# Patient Record
Sex: Male | Born: 1965 | Race: Black or African American | Hispanic: No | Smoking: Former smoker
Health system: Southern US, Community
[De-identification: ages and names within clinical notes are randomized; demographics above are authoritative.]

## PROBLEM LIST (undated history)

## (undated) DIAGNOSIS — E119 Type 2 diabetes mellitus without complications: Secondary | ICD-10-CM

## (undated) DIAGNOSIS — I10 Essential (primary) hypertension: Secondary | ICD-10-CM

---

## 2001-10-29 ENCOUNTER — Encounter: Payer: Self-pay | Admitting: Family Medicine

## 2001-10-29 ENCOUNTER — Ambulatory Visit (HOSPITAL_COMMUNITY): Admission: RE | Admit: 2001-10-29 | Discharge: 2001-10-29 | Payer: Self-pay | Admitting: Family Medicine

## 2004-10-12 ENCOUNTER — Emergency Department (HOSPITAL_COMMUNITY): Admission: EM | Admit: 2004-10-12 | Discharge: 2004-10-12 | Payer: Self-pay | Admitting: *Deleted

## 2005-02-13 ENCOUNTER — Ambulatory Visit: Payer: Self-pay | Admitting: Family Medicine

## 2005-02-15 ENCOUNTER — Encounter (HOSPITAL_COMMUNITY): Admission: RE | Admit: 2005-02-15 | Discharge: 2005-03-17 | Payer: Self-pay | Admitting: Family Medicine

## 2007-12-19 ENCOUNTER — Encounter: Payer: Self-pay | Admitting: Family Medicine

## 2008-08-31 ENCOUNTER — Emergency Department (HOSPITAL_COMMUNITY): Admission: EM | Admit: 2008-08-31 | Discharge: 2008-08-31 | Payer: Self-pay | Admitting: Emergency Medicine

## 2011-01-19 NOTE — Letter (Signed)
Summary: Historic Patient File  Historic Patient File   Imported By: Lind Guest 09/19/2010 11:27:15  _____________________________________________________________________  External Attachment:    Type:   Image     Comment:   External Document

## 2015-12-07 ENCOUNTER — Emergency Department (HOSPITAL_COMMUNITY)
Admission: EM | Admit: 2015-12-07 | Discharge: 2015-12-07 | Disposition: A | Discharge status: Home / Self Care | Payer: 59 | Attending: Emergency Medicine | Admitting: Emergency Medicine

## 2015-12-07 ENCOUNTER — Encounter (HOSPITAL_COMMUNITY): Payer: Self-pay | Admitting: Emergency Medicine

## 2015-12-07 DIAGNOSIS — J0300 Acute streptococcal tonsillitis, unspecified: Secondary | ICD-10-CM | POA: Insufficient documentation

## 2015-12-07 DIAGNOSIS — J029 Acute pharyngitis, unspecified: Secondary | ICD-10-CM | POA: Diagnosis present

## 2015-12-07 DIAGNOSIS — Z87891 Personal history of nicotine dependence: Secondary | ICD-10-CM | POA: Diagnosis not present

## 2015-12-07 DIAGNOSIS — M542 Cervicalgia: Secondary | ICD-10-CM | POA: Diagnosis not present

## 2015-12-07 MED ORDER — IBUPROFEN 400 MG PO TABS
600.0000 mg | ORAL_TABLET | Freq: Once | ORAL | Status: AC
  Administered 2015-12-07: 600 mg via ORAL
  Filled 2015-12-07: qty 2

## 2015-12-07 MED ORDER — PREDNISONE 20 MG PO TABS: 60.0000 mg | ORAL_TABLET | Freq: Every day | ORAL | Status: DC

## 2015-12-07 MED ORDER — PENICILLIN G BENZATHINE 1200000 UNIT/2ML IM SUSP
1.2000 10*6.[IU] | Freq: Once | INTRAMUSCULAR | Status: AC
  Administered 2015-12-07: 1.2 10*6.[IU] via INTRAMUSCULAR
  Filled 2015-12-07: qty 2

## 2015-12-07 MED ORDER — IBUPROFEN 600 MG PO TABS: 600.0000 mg | ORAL_TABLET | Freq: Four times a day (QID) | ORAL | Status: DC | PRN

## 2015-12-07 MED ORDER — DEXAMETHASONE SODIUM PHOSPHATE 10 MG/ML IJ SOLN
10.0000 mg | Freq: Once | INTRAMUSCULAR | Status: AC
  Administered 2015-12-07: 10 mg via INTRAMUSCULAR
  Filled 2015-12-07: qty 1

## 2015-12-07 NOTE — Discharge Instructions (Signed)
Return immediately for difficulty breathing, increased swelling, difficulty swallowing, voice changes, persistent fever and chills or for any concerns.   Tonsillitis Tonsillitis is an infection of the throat that causes the tonsils to become red, tender, and swollen. Tonsils are collections of lymphoid tissue at the back of the throat. Each tonsil has crevices (crypts). Tonsils help fight nose and throat infections and keep infection from spreading to other parts of the body for the first 18 months of life.  CAUSES Sudden (acute) tonsillitis is usually caused by infection with streptococcal bacteria. Long-lasting (chronic) tonsillitis occurs when the crypts of the tonsils become filled with pieces of food and bacteria, which makes it easy for the tonsils to become repeatedly infected. SYMPTOMS  Symptoms of tonsillitis include:  A sore throat, with possible difficulty swallowing.  White patches on the tonsils.  Fever.  Tiredness.  New episodes of snoring during sleep, when you did not snore before.  Small, foul-smelling, yellowish-white pieces of material (tonsilloliths) that you occasionally cough up or spit out. The tonsilloliths can also cause you to have bad breath. DIAGNOSIS Tonsillitis can be diagnosed through a physical exam. Diagnosis can be confirmed with the results of lab tests, including a throat culture. TREATMENT  The goals of tonsillitis treatment include the reduction of the severity and duration of symptoms and prevention of associated conditions. Symptoms of tonsillitis can be improved with the use of steroids to reduce the swelling. Tonsillitis caused by bacteria can be treated with antibiotic medicines. Usually, treatment with antibiotic medicines is started before the cause of the tonsillitis is known. However, if it is determined that the cause is not bacterial, antibiotic medicines will not treat the tonsillitis. If attacks of tonsillitis are severe and frequent, your  health care provider may recommend surgery to remove the tonsils (tonsillectomy). HOME CARE INSTRUCTIONS   Rest as much as possible and get plenty of sleep.  Drink plenty of fluids. While the throat is very sore, eat soft foods or liquids, such as sherbet, soups, or instant breakfast drinks.  Eat frozen ice pops.  Gargle with a warm or cold liquid to help soothe the throat. Mix 1/4 teaspoon of salt and 1/4 teaspoon of baking soda in 8 oz of water. SEEK MEDICAL CARE IF:   Large, tender lumps develop in your neck.  A rash develops.  A green, yellow-brown, or bloody substance is coughed up.  You are unable to swallow liquids or food for 24 hours.  You notice that only one of the tonsils is swollen. SEEK IMMEDIATE MEDICAL CARE IF:   You develop any new symptoms such as vomiting, severe headache, stiff neck, chest pain, or trouble breathing or swallowing.  You have severe throat pain along with drooling or voice changes.  You have severe pain, unrelieved with recommended medications.  You are unable to fully open the mouth.  You develop redness, swelling, or severe pain anywhere in the neck.  You have a fever. MAKE SURE YOU:   Understand these instructions.  Will watch your condition.  Will get help right away if you are not doing well or get worse.   This information is not intended to replace advice given to you by your health care provider. Make sure you discuss any questions you have with your health care provider.   Document Released: 09/13/2005 Document Revised: 12/25/2014 Document Reviewed: 05/23/2013 Elsevier Interactive Patient Education [employer]

## 2015-12-07 NOTE — ED Provider Notes (Signed)
CSN: [id number]     Arrival date & time 12/07/15  4098 History   First MD Initiated Contact with Patient 12/07/15 0747     Chief Complaint  Patient presents with  . Sore Throat     (Consider location/radiation/quality/duration/timing/severity/associated sxs/prior Treatment) HPI Patient presents with 2 days of sore throat and chills. States the pain is worse when swallowing. Denies any nasal congestion, itchy eyes or discharge. No fever. No voice changes. No difficulty breathing or cough. No known sick contacts. History reviewed. No pertinent past medical history. History reviewed. No pertinent past surgical history. History reviewed. No pertinent family history. Social History  Substance Use Topics  . Smoking status: Former Smoker    Quit date: 12/06/2006  . Smokeless tobacco: None  . Alcohol Use: 1.8 oz/week    1 Cans of beer, 2 Shots of liquor per week    Review of Systems  Constitutional: Positive for chills. Negative for fever.  HENT: Positive for sore throat. Negative for congestion, ear pain, rhinorrhea, sinus pressure, trouble swallowing and voice change.   Eyes: Negative for pain, discharge, redness and itching.  Respiratory: Negative for cough, wheezing and stridor.   Gastrointestinal: Negative for nausea, vomiting and abdominal pain.  Musculoskeletal: Positive for neck pain. Negative for back pain.  Skin: Negative for rash.  Neurological: Negative for dizziness, weakness, numbness and headaches.  All other systems reviewed and are negative.     Allergies  Review of patient's allergies indicates no known allergies.  Home Medications   Prior to Admission medications   Medication Sig Start Date End Date Taking? Authorizing Provider  aspirin-sod bicarb-citric acid (ALKA-SELTZER) 325 MG TBEF tablet Take 325 mg by mouth every 6 (six) hours as needed.   Yes Historical Provider, MD  guaifenesin (ROBITUSSIN) 100 MG/5ML syrup Take 200 mg by mouth 3 (three) times daily  as needed for cough.   Yes Historical Provider, MD  ibuprofen (ADVIL,MOTRIN) 600 MG tablet Take 1 tablet (600 mg total) by mouth every 6 (six) hours as needed. 12/07/15   Loren Racer, MD  predniSONE (DELTASONE) 20 MG tablet Take 3 tablets (60 mg total) by mouth daily. 12/07/15   Loren Racer, MD   BP 134/84 mmHg  Pulse 81  Temp(Src) 98.8 F (37.1 C) (Oral)  Resp 16  Ht  (1.778 m)  Wt 320 lb (145.151 kg)  BMI 45.92 kg/m2  SpO2 93% Physical Exam  Constitutional: He is oriented to person, place, and time. He appears well-developed and well-nourished. No distress.  HENT:  Head: Normocephalic and atraumatic.  Mouth/Throat: Oropharyngeal exudate present.  Bilateral tonsillar and uvula swelling. Exudate noted. Uvula is midline. Do not see evidence of PTA.  Eyes: EOM are normal. Pupils are equal, round, and reactive to light.  Neck: Normal range of motion. Neck supple.  Submandibular swelling with bilateral lymphadenopathy  Cardiovascular: Normal rate and regular rhythm.  Exam reveals no gallop and no friction rub.   No murmur heard. Pulmonary/Chest: Effort normal and breath sounds normal. No stridor. No respiratory distress. He has no wheezes. He has no rales. He exhibits no tenderness.  Abdominal: Soft. Bowel sounds are normal. There is no tenderness.  Musculoskeletal: Normal range of motion. He exhibits no edema or tenderness.  Lymphadenopathy:    He has cervical adenopathy.  Neurological: He is alert and oriented to person, place, and time.  Moves all extremities without deficit. Sensation is fully intact.  Skin: Skin is warm and dry. No rash noted. No erythema.  Psychiatric: He  has a normal mood and affect. His behavior is normal.  Nursing note and vitals reviewed.   ED Course  Procedures (including critical care time) Labs Review Labs Reviewed - No data to display  Imaging Review No results found. I have personally reviewed and evaluated these images and lab  results as part of my medical decision-making.   EKG Interpretation None      MDM   Final diagnoses:  Strep tonsillitis    Patient with evidence of strep pharyngitis. No respiratory distress. Patient does have significant pharyngeal swelling but suspect some of this is baseline. We'll give IM Decadron and IM penicillin. We'll observe in the emergency department for improvement of the swelling.  Patient states he's had improved swelling and ability to swallow after steroids.  Patient observed for several hours in the emergency department with increasing improvement of posterior oropharyngeal swelling. We'll discharge home. Return precautions have been given and the patient has voiced understanding.  Loren Raceravid Sheelah Ritacco, MD 12/07/15 [id number]

## 2015-12-07 NOTE — ED Notes (Signed)
MD at bedside. 

## 2015-12-07 NOTE — ED Notes (Signed)
Sore throat since Sunday, rates pain 9/10.

## 2018-04-03 ENCOUNTER — Other Ambulatory Visit: Payer: Self-pay

## 2018-04-03 ENCOUNTER — Emergency Department (HOSPITAL_COMMUNITY): Payer: BLUE CROSS/BLUE SHIELD

## 2018-04-03 ENCOUNTER — Encounter (HOSPITAL_COMMUNITY): Payer: Self-pay

## 2018-04-03 ENCOUNTER — Emergency Department (HOSPITAL_COMMUNITY)
Admission: EM | Admit: 2018-04-03 | Discharge: 2018-04-03 | Disposition: A | Discharge status: Home / Self Care | Payer: BLUE CROSS/BLUE SHIELD | Attending: Emergency Medicine | Admitting: Emergency Medicine

## 2018-04-03 DIAGNOSIS — M25562 Pain in left knee: Secondary | ICD-10-CM | POA: Diagnosis present

## 2018-04-03 DIAGNOSIS — Z87891 Personal history of nicotine dependence: Secondary | ICD-10-CM | POA: Diagnosis not present

## 2018-04-03 MED ORDER — KETOROLAC TROMETHAMINE 60 MG/2ML IM SOLN
60.0000 mg | Freq: Once | INTRAMUSCULAR | Status: AC
  Administered 2018-04-03: 60 mg via INTRAMUSCULAR
  Filled 2018-04-03: qty 2

## 2018-04-03 MED ORDER — DICLOFENAC SODIUM 75 MG PO TBEC: 75.0000 mg | DELAYED_RELEASE_TABLET | Freq: Two times a day (BID) | ORAL | 0 refills | Status: DC

## 2018-04-03 NOTE — ED Provider Notes (Signed)
[facility] EMERGENCY DEPARTMENT Provider Note   CSN: [id number] Arrival date & time: 04/03/18  0740     History   Chief Complaint Chief Complaint  Patient presents with  . Knee Pain    HPI Marcus ReeveWilliam D Mis is a 52 y.o. male.  HPI   Marcus Valencia is a 52 y.o. male who presents to the Emergency Department complaining of ongoing left knee pain.  He describes an aching pain to the medial side of the left knee associated with walking and bending of the knee.  Symptoms have been present for 2 months. Pain resolves when at rest.  Does not feel weak or unstable.  He has been applying OTC muscle rub and taking ASA without relief.  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    History reviewed. No pertinent past medical history.  There are no active problems to display for this patient.   History reviewed. No pertinent surgical history.     Home Medications    Prior to Admission medications   Medication Sig Start Date End Date Taking? Authorizing Provider  aspirin-sod bicarb-citric acid (ALKA-SELTZER) 325 MG TBEF tablet Take 325 mg by mouth every 6 (six) hours as needed.    [provider]  guaifenesin (ROBITUSSIN) 100 MG/5ML syrup Take 200 mg by mouth 3 (three) times daily as needed for cough.    [provider]  ibuprofen (ADVIL,MOTRIN) 600 MG tablet Take 1 tablet (600 mg total) by mouth every 6 (six) hours as needed. 12/07/15   Loren RacerYelverton, David, MD  predniSONE (DELTASONE) 20 MG tablet Take 3 tablets (60 mg total) by mouth daily. 12/07/15   Loren RacerYelverton, David, MD    Family History No family history on file.  Social History Social History   Tobacco Use  . Smoking status: Former Smoker    Last attempt to quit: 12/06/2006    Years since quitting: 11.3  . Smokeless tobacco: Never Used  Substance Use Topics  . Alcohol use: Yes    Alcohol/week:  1.8 oz    Types: 1 Cans of beer, 2 Shots of liquor per week    Comment: occ  . Drug use: No     Allergies   Patient has no known allergies.   Review of Systems Review of Systems  Constitutional: Negative for chills and fever.  Musculoskeletal: Positive for arthralgias (left knee pain). Negative for joint swelling.  Skin: Negative for color change and wound.  Neurological: Negative for weakness and numbness.  All other systems reviewed and are negative.    Physical Exam Updated Vital Signs BP (!) 157/96 (BP Location: Left Arm)   Pulse 78   Temp 98.2 F (36.8 C) (Oral)   Resp 18   Ht 5\' 10"  (1.778 m)   Wt (!) 165.6 kg (365 lb)   SpO2 96%   BMI 52.37 kg/m   Physical Exam  Constitutional: He is oriented to person, place, and time. He appears well-developed and well-nourished. No distress.  Cardiovascular: Normal rate, regular rhythm and intact distal pulses.  Pulmonary/Chest: Effort normal and breath sounds normal.  Musculoskeletal: He exhibits tenderness. He exhibits no edema or deformity.  Focal ttp of the medial left knee.  No erythema, edema crepitus, effusion, or step-off deformity.  Pt has full ROM of the joint.  Neg drawer sign  Neurological: He is alert and oriented to person, place, and  time. He exhibits normal muscle tone. Coordination normal.  Skin: Skin is warm and dry. Capillary refill takes less than 2 seconds. No erythema.  Nursing note and vitals reviewed.    ED Treatments / Results  Labs (all labs ordered are listed, but only abnormal results are displayed) Labs Reviewed - No data to display  EKG None  Radiology Dg Knee Complete 4 Views Left  Result Date: 04/03/2018 CLINICAL DATA:  Left knee pain for several months without known injury. EXAM: LEFT KNEE - COMPLETE 4+ VIEW COMPARISON:  None. FINDINGS: No evidence of fracture, dislocation, or joint effusion. No evidence of arthropathy or other focal bone abnormality. Soft tissues are unremarkable.  IMPRESSION: Normal left knee. Electronically Signed   By: Lupita Raider, M.D.   On: 04/03/2018 08:35    Procedures Procedures (including critical care time)  Medications Ordered in ED Medications - No data to display   Initial Impression / Assessment and Plan / ED Course  I have reviewed the triage vital signs and the nursing notes.  Pertinent labs & imaging results that were available during my care of the patient were reviewed by me and considered in my medical decision making (see chart for details).     Pt with persistent pain to the medial knee.  XR neg.  No obvious ligament instability.  No concerning sx's for septic joint.  Agrees to ice, NSAID and close ortho f/u in week if not improving  Knee sleeve applied for support.    Final Clinical Impressions(s) / ED Diagnoses   Final diagnoses:  Acute pain of left knee    ED Discharge Orders    None       Pauline Aus, PA-C 04/03/18 0915    Bethann Berkshire, MD 04/04/18 [phone]

## 2018-04-03 NOTE — Discharge Instructions (Addendum)
Apply ice packs on/off to your knee.  Wear the brace as needed for support, but do not wear it continuously or at bedtime.  Call Dr. Mort SawyersHarrison's office to arrange a follow-up appt in one week if not improving

## 2018-04-03 NOTE — ED Triage Notes (Signed)
Pt c/o left knee pain for the past few months.  Denies injury.  Pt says is just tired of it hurting.

## 2018-08-05 ENCOUNTER — Other Ambulatory Visit: Payer: Self-pay

## 2018-08-05 ENCOUNTER — Emergency Department (HOSPITAL_COMMUNITY)
Admission: EM | Admit: 2018-08-05 | Discharge: 2018-08-05 | Disposition: A | Discharge status: Home / Self Care | Payer: BLUE CROSS/BLUE SHIELD | Attending: Emergency Medicine | Admitting: Emergency Medicine

## 2018-08-05 ENCOUNTER — Encounter (HOSPITAL_COMMUNITY): Payer: Self-pay | Admitting: Emergency Medicine

## 2018-08-05 DIAGNOSIS — X500XXA Overexertion from strenuous movement or load, initial encounter: Secondary | ICD-10-CM | POA: Insufficient documentation

## 2018-08-05 DIAGNOSIS — S39012A Strain of muscle, fascia and tendon of lower back, initial encounter: Secondary | ICD-10-CM | POA: Insufficient documentation

## 2018-08-05 DIAGNOSIS — Y999 Unspecified external cause status: Secondary | ICD-10-CM | POA: Insufficient documentation

## 2018-08-05 DIAGNOSIS — Z79899 Other long term (current) drug therapy: Secondary | ICD-10-CM | POA: Insufficient documentation

## 2018-08-05 DIAGNOSIS — T148XXA Other injury of unspecified body region, initial encounter: Secondary | ICD-10-CM

## 2018-08-05 DIAGNOSIS — Y9389 Activity, other specified: Secondary | ICD-10-CM | POA: Insufficient documentation

## 2018-08-05 DIAGNOSIS — Y929 Unspecified place or not applicable: Secondary | ICD-10-CM | POA: Insufficient documentation

## 2018-08-05 DIAGNOSIS — Z87891 Personal history of nicotine dependence: Secondary | ICD-10-CM | POA: Insufficient documentation

## 2018-08-05 DIAGNOSIS — Y939 Activity, unspecified: Secondary | ICD-10-CM | POA: Insufficient documentation

## 2018-08-05 MED ORDER — ONDANSETRON HCL 4 MG PO TABS
4.0000 mg | ORAL_TABLET | Freq: Once | ORAL | Status: AC
  Administered 2018-08-05: 4 mg via ORAL
  Filled 2018-08-05: qty 1

## 2018-08-05 MED ORDER — DICLOFENAC SODIUM 75 MG PO TBEC: 75.0000 mg | DELAYED_RELEASE_TABLET | Freq: Two times a day (BID) | ORAL | 0 refills | Status: DC

## 2018-08-05 MED ORDER — KETOROLAC TROMETHAMINE 60 MG/2ML IM SOLN
60.0000 mg | Freq: Once | INTRAMUSCULAR | Status: AC
  Administered 2018-08-05: 60 mg via INTRAMUSCULAR
  Filled 2018-08-05: qty 2

## 2018-08-05 MED ORDER — CYCLOBENZAPRINE HCL 10 MG PO TABS: 10.0000 mg | ORAL_TABLET | Freq: Three times a day (TID) | ORAL | 0 refills | Status: DC

## 2018-08-05 MED ORDER — DEXAMETHASONE 4 MG PO TABS: 4.0000 mg | ORAL_TABLET | Freq: Two times a day (BID) | ORAL | 0 refills | Status: DC

## 2018-08-05 MED ORDER — DEXAMETHASONE SODIUM PHOSPHATE 10 MG/ML IJ SOLN
10.0000 mg | Freq: Once | INTRAMUSCULAR | Status: AC
  Administered 2018-08-05: 10 mg via INTRAMUSCULAR
  Filled 2018-08-05: qty 1

## 2018-08-05 NOTE — ED Triage Notes (Signed)
Patient complaining of lower back pain x 5 days. Denies injury.

## 2018-08-05 NOTE — Discharge Instructions (Addendum)
Please use a heating pad to the lower back and buttocks area when sitting or resting.  Please use Decadron and diclofenac 2 times daily with a meal.  May use Flexeril 3 times daily for spasm pain. This medication may cause drowsiness. Please do not drink, drive, or participate in activity that requires concentration while taking this medication.

## 2018-08-09 NOTE — ED Provider Notes (Signed)
[facility] Provider [facility]   CSN: [id number] Arrival date & time: 08/05/18  1018     History   Chief Complaint Chief Complaint  Patient presents with  . Back Pain    HPI Marcus Valencia is a 52 y.o. male.  The history is provided by the patient.  Back Pain   This is a new problem. The current episode started more than 2 days ago. The problem occurs daily. The problem has been gradually worsening. The pain is associated with lifting heavy objects. The pain is present in the lumbar spine. The quality of the pain is described as stabbing (spasm pain). The pain does not radiate. The pain is moderate. The symptoms are aggravated by bending, twisting and certain positions. The pain is the same all the time. Pertinent negatives include no chest pain, no fever, no numbness, no abdominal pain, no bowel incontinence, no perianal numbness, no bladder incontinence, no dysuria and no paresthesias. He has tried heat for the symptoms. The treatment provided no relief.    History reviewed. No pertinent past medical history.  There are no active problems to display for this patient.   History reviewed. No pertinent surgical history.      Home Medications    Prior to Admission medications   Medication Sig Start Date End Date Taking? Authorizing Provider  aspirin-sod bicarb-citric acid (ALKA-SELTZER) 325 MG TBEF tablet Take 325 mg by mouth every 6 (six) hours as needed.    [provider]  cyclobenzaprine (FLEXERIL) 10 MG tablet Take 1 tablet (10 mg total) by mouth 3 (three) times daily. 08/05/18   Ivery QualeBryant, Allard Lightsey, PA-C  dexamethasone (DECADRON) 4 MG tablet Take 1 tablet (4 mg total) by mouth 2 (two) times daily with a meal. 08/05/18   Ivery QualeBryant, Vlada Uriostegui, PA-C  diclofenac (VOLTAREN) 75 MG EC tablet Take 1 tablet (75 mg total) by mouth 2 (two) times daily. 08/05/18   Ivery QualeBryant, Mychael Smock, PA-C  guaifenesin (ROBITUSSIN) 100 MG/5ML syrup Take 200 mg by mouth 3 (three) times daily  as needed for cough.    [provider]    Family History History reviewed. No pertinent family history.  Social History Social History   Tobacco Use  . Smoking status: Former Smoker    Last attempt to quit: 12/06/2006    Years since quitting: 11.6  . Smokeless tobacco: Never Used  Substance Use Topics  . Alcohol use: Yes    Alcohol/week: 3.0 standard drinks    Types: 1 Cans of beer, 2 Shots of liquor per week    Comment: occ  . Drug use: No     Allergies   Patient has no known allergies.   Review of Systems Review of Systems  Constitutional: Negative for activity change and fever.       All ROS Neg except as noted in HPI  HENT: Negative for nosebleeds.   Eyes: Negative for photophobia and discharge.  Respiratory: Negative for cough, shortness of breath and wheezing.   Cardiovascular: Negative for chest pain and palpitations.  Gastrointestinal: Negative for abdominal pain, blood in stool and bowel incontinence.  Genitourinary: Negative for bladder incontinence, dysuria, frequency and hematuria.  Musculoskeletal: Positive for back pain. Negative for arthralgias and neck pain.  Skin: Negative.   Neurological: Negative for dizziness, seizures, speech difficulty, numbness and paresthesias.  Psychiatric/Behavioral: Negative for confusion and hallucinations.     Physical Exam Updated Vital Signs BP 136/90 (BP Location: Right Arm)   Pulse 69   Temp 98.3  F (36.8 C) (Oral)   Resp 20   Ht 5\' 10"  (1.778 m)   Wt (!) 164.2 kg   SpO2 100%   BMI 51.94 kg/m   Physical Exam  Constitutional: He is oriented to person, place, and time. He appears well-developed and well-nourished.  Non-toxic appearance.  HENT:  Head: Normocephalic.  Right Ear: Tympanic membrane and external ear normal.  Left Ear: Tympanic membrane and external ear normal.  Eyes: Pupils are equal, round, and reactive to light. EOM and lids are normal.  Neck: Normal range of motion. Neck supple.  Carotid bruit is not present.  Cardiovascular: Normal rate, regular rhythm, normal heart sounds, intact distal pulses and normal pulses.  Pulmonary/Chest: Breath sounds normal. No respiratory distress.  Abdominal: Soft. Bowel sounds are normal. There is no tenderness. There is no guarding.  Musculoskeletal:       Lumbar back: He exhibits decreased range of motion and spasm.  There is spasm of the lower back.  Pain with attempted range of motion.  Pain with change of position.  No radiation of the back pain.  No change in sensation in the saddle area.  Lymphadenopathy:       Head (right side): No submandibular adenopathy present.       Head (left side): No submandibular adenopathy present.    He has no cervical adenopathy.  Neurological: He is alert and oriented to person, place, and time. He has normal strength. No cranial nerve deficit or sensory deficit.  Skin: Skin is warm and dry.  Psychiatric: He has a normal mood and affect. His speech is normal.  Nursing note and vitals reviewed.    ED Treatments / Results  Labs (all labs ordered are listed, but only abnormal results are displayed) Labs Reviewed - No data to display  EKG None  Radiology No results found.  Procedures Procedures (including critical care time)  Medications Ordered in ED Medications  ketorolac (TORADOL) injection 60 mg (60 mg Intramuscular Given 08/05/18 1129)  ondansetron (ZOFRAN) tablet 4 mg (4 mg Oral Given 08/05/18 1129)  dexamethasone (DECADRON) injection 10 mg (10 mg Intramuscular Given 08/05/18 1129)     Initial Impression / Assessment and Plan / ED Course  I have reviewed the triage vital signs and the nursing notes.  Pertinent labs & imaging results that were available during my care of the patient were reviewed by me and considered in my medical decision making (see chart for details).       Final Clinical Impressions(s) / ED Diagnoses MDm  Vital signs within normal limits.  Pulse  oximetry is 100% on room air.  Within normal limits by my interpretation.  Patient states that he does a lot of twisting, and lifting at work and he thinks he may have injured his back.  Examination suggests muscle strain of the lumbar area.  There is no evidence for cauda equina or other emergent changes.  Patient's gait is slow, but intact.  No evidence of foot drop or signs of weakness.  Patient will be treated with Decadron, diclofenac, and Flexeril.  I have asked him to continue the use of heating pad or heat to his back.  I have also given him the name of the orthopedic on-call for additional evaluation and management if not improving.  Patient is in agreement with this plan.   Final diagnoses:  Muscle strain    ED Discharge Orders         Ordered    dexamethasone (DECADRON) 4  MG tablet  2 times daily with meals     08/05/18 1122    diclofenac (VOLTAREN) 75 MG EC tablet  2 times daily     08/05/18 1122    cyclobenzaprine (FLEXERIL) 10 MG tablet  3 times daily     08/05/18 1122           Ivery Quale, PA-C 08/09/18 1055    Sabas Sous, MD 08/09/18 1650

## 2018-08-11 ENCOUNTER — Other Ambulatory Visit: Payer: Self-pay

## 2018-08-11 ENCOUNTER — Emergency Department (HOSPITAL_COMMUNITY): Payer: PRIVATE HEALTH INSURANCE

## 2018-08-11 ENCOUNTER — Emergency Department (HOSPITAL_COMMUNITY)
Admission: EM | Admit: 2018-08-11 | Discharge: 2018-08-11 | Disposition: A | Discharge status: Home / Self Care | Payer: PRIVATE HEALTH INSURANCE | Attending: Emergency Medicine | Admitting: Emergency Medicine

## 2018-08-11 ENCOUNTER — Encounter (HOSPITAL_COMMUNITY): Payer: Self-pay | Admitting: Emergency Medicine

## 2018-08-11 DIAGNOSIS — M545 Low back pain, unspecified: Secondary | ICD-10-CM

## 2018-08-11 DIAGNOSIS — Z79899 Other long term (current) drug therapy: Secondary | ICD-10-CM | POA: Insufficient documentation

## 2018-08-11 DIAGNOSIS — Z87891 Personal history of nicotine dependence: Secondary | ICD-10-CM | POA: Diagnosis not present

## 2018-08-11 MED ORDER — MELOXICAM 7.5 MG PO TABS: 7.5000 mg | ORAL_TABLET | Freq: Every day | ORAL | 0 refills | Status: DC

## 2018-08-11 MED ORDER — METHOCARBAMOL 500 MG PO TABS: 500.0000 mg | ORAL_TABLET | Freq: Two times a day (BID) | ORAL | 0 refills | Status: DC

## 2018-08-11 NOTE — ED Triage Notes (Signed)
Pt c/o lower back pain. Was dx with lower back strain per pt and given medication here that did not help. States he went back to work on Friday and the back pain is worse. Motrin and ASA with no improvement.

## 2018-08-11 NOTE — Discharge Instructions (Addendum)
Schedule to see the Orthopaedist for evaluation if pain persist.

## 2018-08-12 NOTE — ED Provider Notes (Signed)
[facility] EMERGENCY DEPARTMENT Provider Note   CSN: [id number] Arrival date & time: 08/11/18  1108     History   Chief Complaint Chief Complaint  Patient presents with  . Back Pain    HPI Marcus Valencia is a 52 y.o. male.  The history is provided by the patient. No language interpreter was used.  Back Pain   This is a new problem. The current episode started more than 1 week ago. The problem occurs constantly. The problem has been gradually worsening. The pain is associated with lifting heavy objects. The pain is present in the lumbar spine. The pain is moderate. The symptoms are aggravated by bending. Pertinent negatives include no bladder incontinence, no paresis and no tingling. He has tried nothing for the symptoms. The treatment provided no relief. Risk factors include obesity.    History reviewed. No pertinent past medical history.  There are no active problems to display for this patient.   History reviewed. No pertinent surgical history.      Home Medications    Prior to Admission medications   Medication Sig Start Date End Date Taking? Authorizing Provider  aspirin-sod bicarb-citric acid (ALKA-SELTZER) 325 MG TBEF tablet Take 325 mg by mouth every 6 (six) hours as needed.    [provider]  cyclobenzaprine (FLEXERIL) 10 MG tablet Take 1 tablet (10 mg total) by mouth 3 (three) times daily. 08/05/18   Ivery QualeBryant, Hob[place], PA-C  dexamethasone (DECADRON) 4 MG tablet Take 1 tablet (4 mg total) by mouth 2 (two) times daily with a meal. 08/05/18   Ivery QualeBryant, Hobson, PA-C  diclofenac (VOLTAREN) 75 MG EC tablet Take 1 tablet (75 mg total) by mouth 2 (two) times daily. 08/05/18   Ivery QualeBryant, Hobson, PA-C  guaifenesin (ROBITUSSIN) 100 MG/5ML syrup Take 200 mg by mouth 3 (three) times daily as needed for cough.    [provider]  meloxicam (MOBIC) 7.5 MG tablet Take 1 tablet (7.5 mg total) by mouth daily. 08/11/18   Elson AreasSofia, Mayo Owczarzak K, PA-C  methocarbamol (ROBAXIN) 500  MG tablet Take 1 tablet (500 mg total) by mouth 2 (two) times daily. 08/11/18   Elson AreasSofia, Ineta Sinning K, PA-C    Family History History reviewed. No pertinent family history.  Social History Social History   Tobacco Use  . Smoking status: Former Smoker    Last attempt to quit: 12/06/2006    Years since quitting: 11.6  . Smokeless tobacco: Never Used  Substance Use Topics  . Alcohol use: Yes    Alcohol/week: 3.0 standard drinks    Types: 1 Cans of beer, 2 Shots of liquor per week    Comment: occ  . Drug use: No     Allergies   Patient has no known allergies.   Review of Systems Review of Systems  Genitourinary: Negative for bladder incontinence.  Musculoskeletal: Positive for back pain.  Neurological: Negative for tingling.  All other systems reviewed and are negative.    Physical Exam Updated Vital Signs BP 133/63 (BP Location: Right Arm)   Pulse 88   Temp 98.2 F (36.8 C) (Oral)   Resp 18   Ht 5\' 10"  (1.778 m)   Wt (!) 164.2 kg   SpO2 100%   BMI 51.94 kg/m   Physical Exam  Constitutional: He appears well-developed.  Morbidly obese  HENT:  Head: Normocephalic and atraumatic.  Eyes: Conjunctivae are normal.  Neck: Neck supple.  Cardiovascular: Normal rate and regular rhythm.  No murmur heard. Pulmonary/Chest: Effort normal and breath  sounds normal. No respiratory distress.  Abdominal: Soft. There is no tenderness.  Musculoskeletal: He exhibits no edema.  Neurological: He is alert.  Skin: Skin is warm and dry.  Psychiatric: He has a normal mood and affect.  Nursing note and vitals reviewed.    ED Treatments / Results  Labs (all labs ordered are listed, but only abnormal results are displayed) Labs Reviewed - No data to display  EKG None  Radiology Dg Lumbar Spine Complete  Result Date: 08/11/2018 CLINICAL DATA:  Low back pain for 2 weeks. EXAM: LUMBAR SPINE - COMPLETE 4+ VIEW COMPARISON:  None. FINDINGS: Mild facet degenerative changes present at  L4-5 and L5-S1. Vertebral body heights and alignment are maintained. No acute fracture traumatic subluxation is present. IMPRESSION: 1. Mild degenerative change without acute or focal abnormality to explain back pain otherwise. Electronically Signed   By: Marin Roberts M.D.   On: 08/11/2018 13:20    Procedures Procedures (including critical care time)  Medications Ordered in ED Medications - No data to display   Initial Impression / Assessment and Plan / ED Course  I have reviewed the triage vital signs and the nursing notes.  Pertinent labs & imaging results that were available during my care of the patient were reviewed by me and considered in my medical decision making (see chart for details).     Pt counseled on xrays.  Pt advised exercise to strengthen back,  Weight loss.   Final Clinical Impressions(s) / ED Diagnoses   Final diagnoses:  Acute low back pain without sciatica, unspecified back pain laterality    ED Discharge Orders         Ordered    methocarbamol (ROBAXIN) 500 MG tablet  2 times daily     08/11/18 1334    meloxicam (MOBIC) 7.5 MG tablet  Daily     08/11/18 1334        An After Visit Summary was printed and given to the patient.    Elson Areas, [place] 08/12/18 1610    Donnetta Hutching, MD 08/12/18 [phone]

## 2019-11-03 IMAGING — DX DG KNEE COMPLETE 4+V*L*
4 series · 4 of 4 positions shown · non-contrast
Comparison: None.

CLINICAL DATA: Left knee pain for several months without known
injury.

EXAM:
LEFT KNEE - COMPLETE 4+ VIEW

[knee ap (1 of 3)]
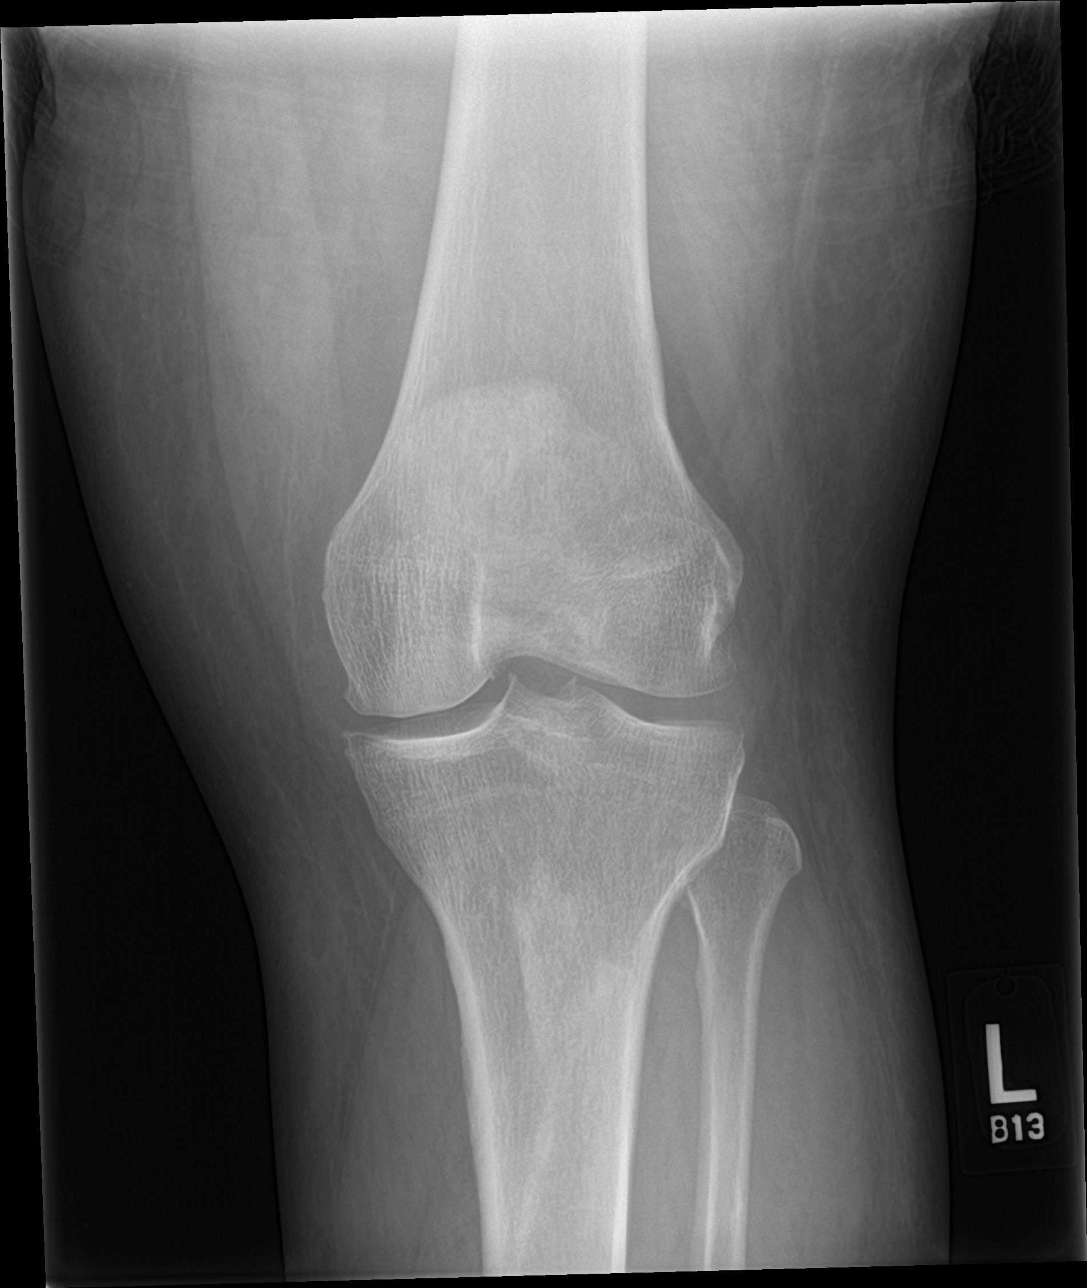

[knee ap (2 of 3)]
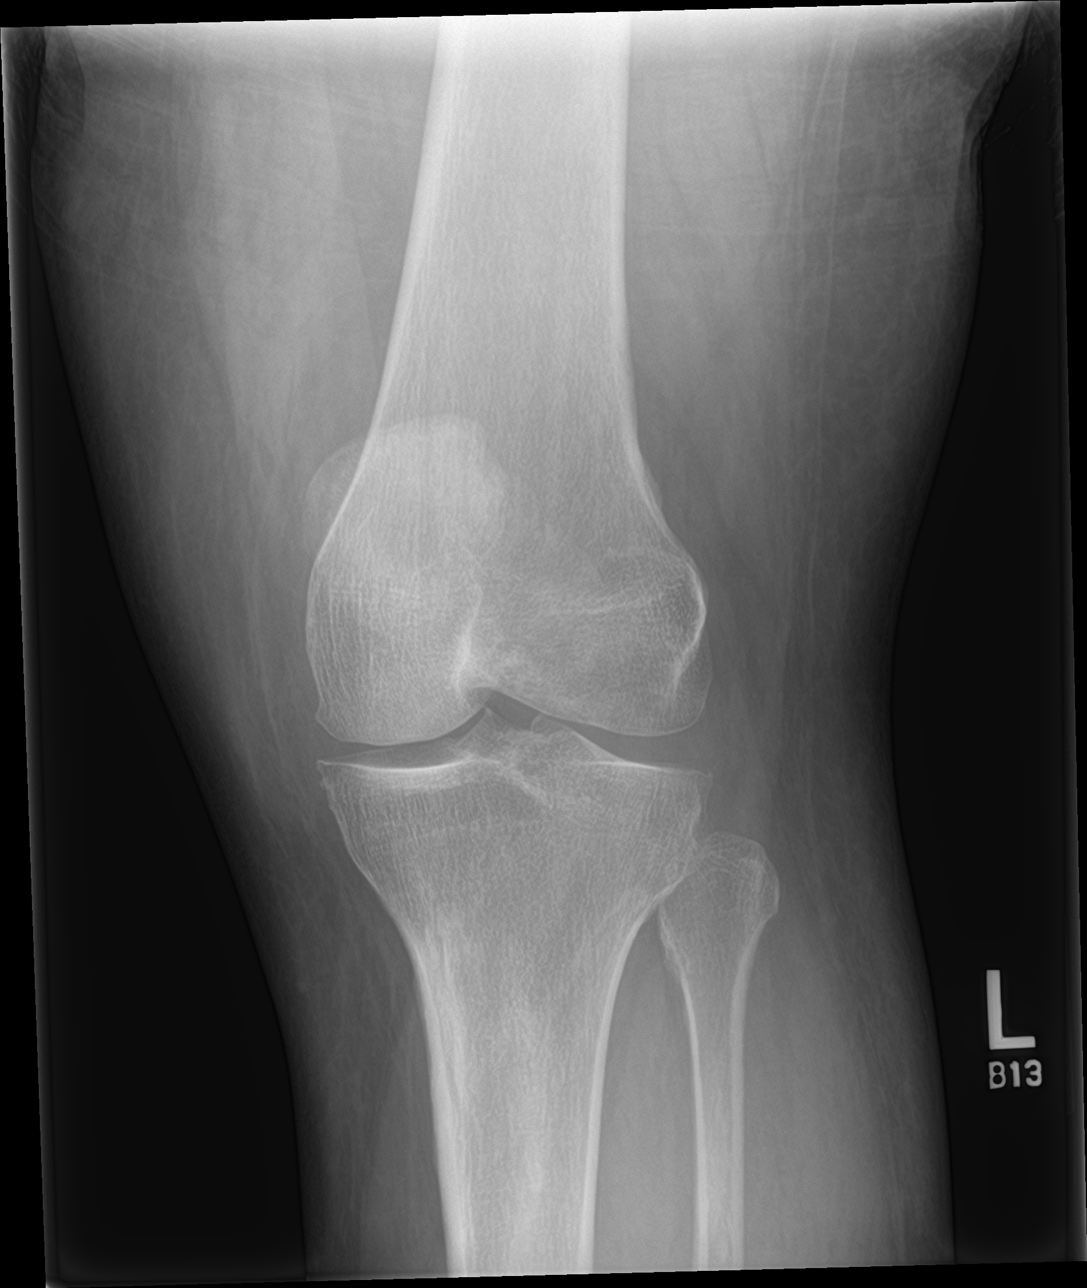

[knee ap (3 of 3)]
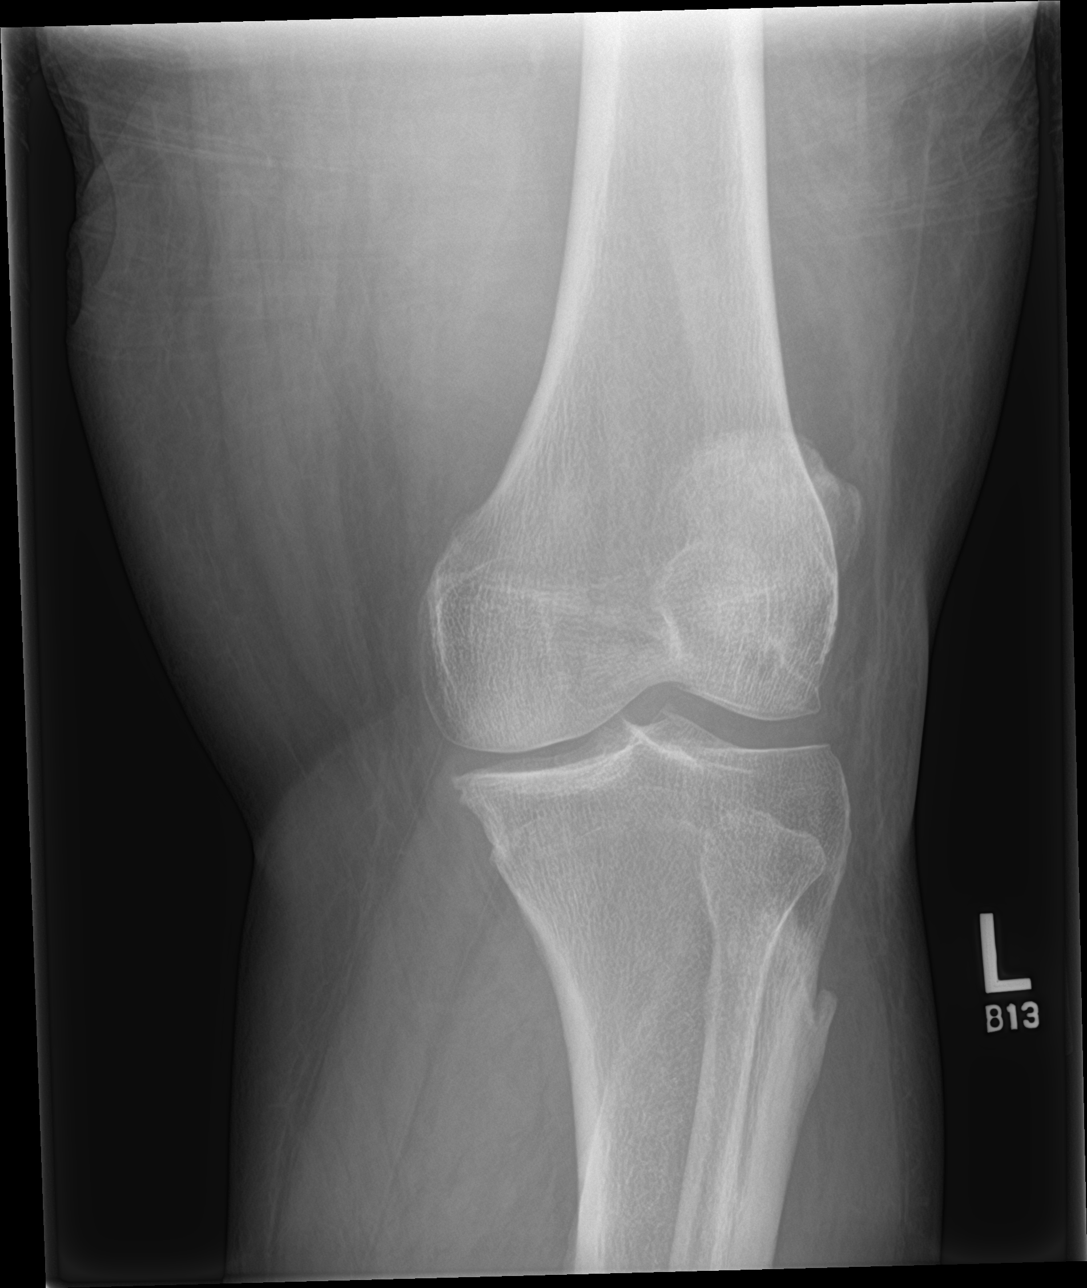

[knee lat]
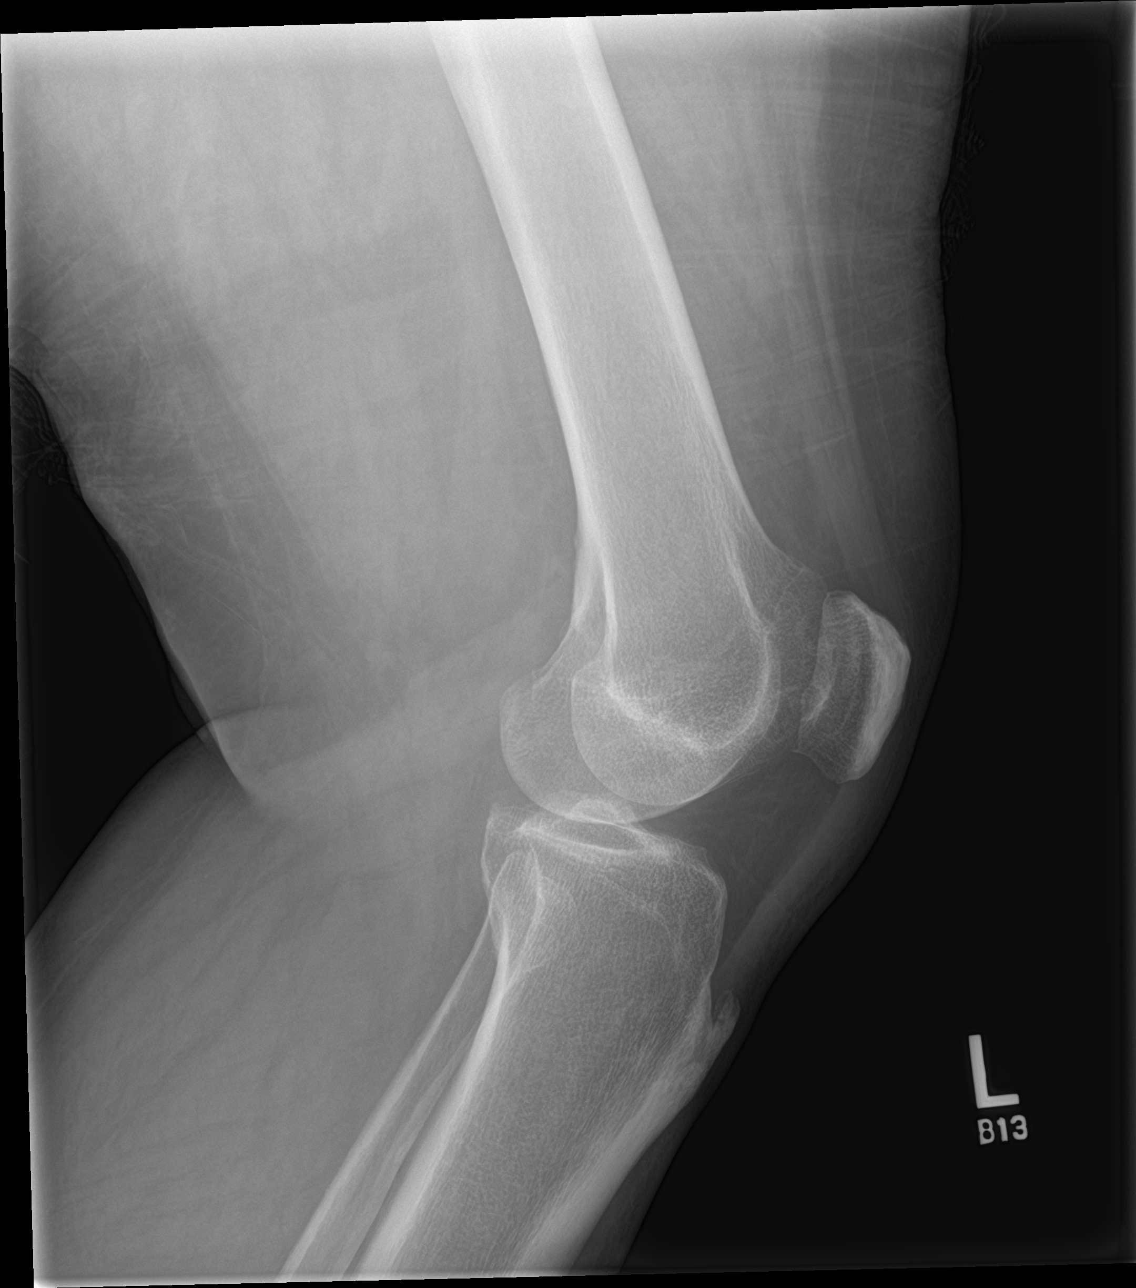

[4 of 4 positions shown; findings below may reference images not displayed]

FINDINGS: No evidence of fracture, dislocation, or joint effusion. No evidence
of arthropathy or other focal bone abnormality. Soft tissues are
unremarkable.
IMPRESSION: Normal left knee.

## 2020-08-10 ENCOUNTER — Other Ambulatory Visit: Payer: Self-pay

## 2020-08-10 ENCOUNTER — Ambulatory Visit
Admission: EM | Admit: 2020-08-10 | Discharge: 2020-08-10 | Disposition: A | Discharge status: Home / Self Care | Payer: 59 | Attending: Emergency Medicine | Admitting: Emergency Medicine

## 2020-08-10 DIAGNOSIS — Z20822 Contact with and (suspected) exposure to covid-19: Secondary | ICD-10-CM

## 2020-08-10 NOTE — Discharge Instructions (Signed)

## 2020-08-10 NOTE — ED Triage Notes (Signed)
Pt presents after being exposed to covid yesterday for test. Denies any symptoms. Pt educated on return precautions.

## 2020-08-11 LAB — SARS-COV-2, NAA 2 DAY TAT

## 2020-08-11 LAB — NOVEL CORONAVIRUS, NAA: SARS-CoV-2, NAA: NOT DETECTED

## 2021-01-14 ENCOUNTER — Ambulatory Visit (INDEPENDENT_AMBULATORY_CARE_PROVIDER_SITE_OTHER): Payer: 59 | Admitting: Internal Medicine

## 2021-01-14 ENCOUNTER — Encounter: Payer: Self-pay | Admitting: Internal Medicine

## 2021-01-14 ENCOUNTER — Other Ambulatory Visit: Payer: Self-pay

## 2021-01-14 VITALS — BP 142/89 | HR 83 | Temp 98.0°F | Resp 18 | Ht 70.0 in | Wt 358.0 lb

## 2021-01-14 DIAGNOSIS — N342 Other urethritis: Secondary | ICD-10-CM | POA: Diagnosis not present

## 2021-01-14 DIAGNOSIS — Z7689 Persons encountering health services in other specified circumstances: Secondary | ICD-10-CM

## 2021-01-14 DIAGNOSIS — R030 Elevated blood-pressure reading, without diagnosis of hypertension: Secondary | ICD-10-CM | POA: Diagnosis not present

## 2021-01-14 MED ORDER — AZITHROMYCIN 500 MG PO TABS: 1000.0000 mg | ORAL_TABLET | Freq: Once | ORAL | 0 refills | Status: AC

## 2021-01-14 MED ORDER — CEFTRIAXONE SODIUM 1 G IJ SOLR
1.0000 g | Freq: Once | INTRAMUSCULAR | Status: AC
Start: 2021-01-14 — End: 2021-01-14
  Administered 2021-01-14: 1 g via INTRAMUSCULAR

## 2021-01-14 NOTE — Progress Notes (Signed)
New Patient Office Visit  Subjective:  Patient ID: Marcus Valencia, male    DOB: February 05, 1966  Age: 55 y.o. MRN: [id number]  CC:  Chief Complaint  Patient presents with  . New Patient (Initial Visit)    New patient has had frequent urination and itching with white discharge thinks he has a yeast infection     HPI Marcus Valencia is a 55 year old male with PMH of morbid obesity who presents for establishing care.  He has been having urinary frequency and urethral discharge, whitish in color. He states that he has pain in the penile area and itching at times. He denies any new sexual partner. Denies any fever, chills, dysuria, hematuria, nausea or vomiting.  His BP was elevated today. He states that he works night shifts and has been awake since last afternoon. Patient denies any headache, dizziness, chest pain, dyspnea or palpitations.  He has had 2 doses of COVID vaccine. Denies flu vaccine.  History reviewed. No pertinent past medical history.  History reviewed. No pertinent surgical history.  History reviewed. No pertinent family history.  Social History   Socioeconomic History  . Marital status: Legally Separated    Spouse name: Not on file  . Number of children: Not on file  . Years of education: Not on file  . Highest education level: Not on file  Occupational History  . Not on file  Tobacco Use  . Smoking status: Former Smoker    Quit date: 12/06/2006    Years since quitting: 14.1  . Smokeless tobacco: Never Used  Substance and Sexual Activity  . Alcohol use: Yes    Alcohol/week: 3.0 standard drinks    Types: 1 Cans of beer, 2 Shots of liquor per week    Comment: occ  . Drug use: No  . Sexual activity: Not on file  Other Topics Concern  . Not on file  Social History Narrative  . Not on file   Social Determinants of Health   Financial Resource Strain: Not on file  Food Insecurity: Not on file  Transportation Needs: Not on file  Physical Activity: Not  on file  Stress: Not on file  Social Connections: Not on file  Intimate Partner Violence: Not on file    ROS Review of Systems  Constitutional: Negative for chills and fever.  HENT: Negative for congestion and sore throat.   Eyes: Negative for pain and discharge.  Respiratory: Negative for cough and shortness of breath.   Cardiovascular: Negative for chest pain and palpitations.  Gastrointestinal: Negative for constipation, diarrhea, nausea and vomiting.  Endocrine: Negative for polydipsia and polyuria.  Genitourinary: Positive for penile discharge and penile pain. Negative for dysuria and hematuria.  Musculoskeletal: Negative for neck pain and neck stiffness.  Skin: Negative for rash.  Neurological: Negative for dizziness, weakness, numbness and headaches.  Psychiatric/Behavioral: Negative for agitation and behavioral problems.    Objective:   Today's Vitals: BP (!) 142/89 (BP Location: Left Arm, Patient Position: Sitting)   Pulse 83   Temp 98 F (36.7 C) (Oral)   Resp 18   Ht '5\' 10"'  (1.778 m)   Wt (!) 358 lb (162.4 kg)   SpO2 99%   BMI 51.37 kg/m   Physical Exam Vitals reviewed.  Constitutional:      General: He is not in acute distress.    Appearance: He is obese. He is not diaphoretic.  HENT:     Head: Normocephalic and atraumatic.     Nose: Nose  normal.     Mouth/Throat:     Mouth: Mucous membranes are moist.  Eyes:     General: No scleral icterus.    Extraocular Movements: Extraocular movements intact.     Pupils: Pupils are equal, round, and reactive to light.  Cardiovascular:     Rate and Rhythm: Normal rate and regular rhythm.     Pulses: Normal pulses.     Heart sounds: Normal heart sounds. No murmur heard.   Pulmonary:     Breath sounds: Normal breath sounds. No wheezing or rales.  Abdominal:     Palpations: Abdomen is soft.     Tenderness: There is no abdominal tenderness.  Musculoskeletal:     Cervical back: Neck supple. No tenderness.      Right lower leg: No edema.     Left lower leg: No edema.  Skin:    General: Skin is warm.     Findings: No rash.  Neurological:     General: No focal deficit present.     Mental Status: He is alert and oriented to person, place, and time.     Sensory: No sensory deficit.     Motor: No weakness.  Psychiatric:        Mood and Affect: Mood normal.        Behavior: Behavior normal.      Assessment & Plan:   Problem List Items Addressed This Visit      Encounter to establish care - Primary   Care established Previous chart reviewed History and medications reviewed with the patient     Relevant Orders  CBC with Differential  CMP14+EGFR  Hemoglobin A1c  Lipid panel  TSH  Vitamin D (25 hydroxy)  HIV antibody (with reflex)  Hepatitis C Antibody    Genitourinary   Urethritis    Whitish discharge and penile pain Check for gonorrhea and chlamydia as well as urethral culture Rocephin given Prescribed Azithromycin       Relevant Medications   azithromycin (ZITHROMAX) 500 MG tablet   Other Relevant Orders   GC/Chlamydia Probe Amp([facility])   Genital Culture   UA/M w/rflx Culture, Routine     Other      Morbid obesity ([place])    Diet modification and moderate exercise/walking at least 150 mins/week.      Elevated BP without diagnosis of hypertension    BP Readings from Last 1 Encounters:  01/14/21 (!) 142/89   New-onset Reports being awake from previous day due to work Advised DASH diet and moderate exercise/walking, at least 150 mins/week          Outpatient Encounter Medications as of 01/14/2021  Medication Sig  . azithromycin (ZITHROMAX) 500 MG tablet Take 2 tablets (1,000 mg total) by mouth once for 1 dose.  . [DISCONTINUED] aspirin-sod bicarb-citric acid (ALKA-SELTZER) 325 MG TBEF tablet Take 325 mg by mouth every 6 (six) hours as needed. (Patient not taking: Reported on 01/14/2021)  . [DISCONTINUED] cyclobenzaprine (FLEXERIL) 10 MG tablet Take 1 tablet (10  mg total) by mouth 3 (three) times daily. (Patient not taking: Reported on 01/14/2021)  . [DISCONTINUED] dexamethasone (DECADRON) 4 MG tablet Take 1 tablet (4 mg total) by mouth 2 (two) times daily with a meal. (Patient not taking: Reported on 01/14/2021)  . [DISCONTINUED] diclofenac (VOLTAREN) 75 MG EC tablet Take 1 tablet (75 mg total) by mouth 2 (two) times daily. (Patient not taking: Reported on 01/14/2021)  . [DISCONTINUED] guaifenesin (ROBITUSSIN) 100 MG/5ML syrup Take 200 mg by mouth 3 (  three) times daily as needed for cough. (Patient not taking: Reported on 01/14/2021)  . [DISCONTINUED] meloxicam (MOBIC) 7.5 MG tablet Take 1 tablet (7.5 mg total) by mouth daily. (Patient not taking: Reported on 01/14/2021)  . [DISCONTINUED] methocarbamol (ROBAXIN) 500 MG tablet Take 1 tablet (500 mg total) by mouth 2 (two) times daily. (Patient not taking: Reported on 01/14/2021)   No facility-administered encounter medications on file as of 01/14/2021.    Follow-up: Return in about 4 weeks (around 02/11/2021) for BP check.   Lindell Spar, MD

## 2021-01-14 NOTE — Assessment & Plan Note (Signed)
Care established Previous chart reviewed History and medications reviewed with the patient 

## 2021-01-14 NOTE — Assessment & Plan Note (Signed)
Diet modification and moderate exercise/walking at least 150 mins/week 

## 2021-01-14 NOTE — Patient Instructions (Addendum)
Please take the antibiotic as prescribed.  Please keep the area clean and dry.  Please follow low carbohydrate diet and perform moderate exercise/walking at least 150 mins/week.

## 2021-01-14 NOTE — Assessment & Plan Note (Signed)
BP Readings from Last 1 Encounters:  01/14/21 (!) 142/89   New-onset Reports being awake from previous day due to work Advised DASH diet and moderate exercise/walking, at least 150 mins/week

## 2021-01-14 NOTE — Assessment & Plan Note (Signed)
Whitish discharge and penile pain Check for gonorrhea and chlamydia as well as urethral culture Rocephin given Prescribed Azithromycin

## 2021-01-15 LAB — CMP14+EGFR
ALT: 20 IU/L (ref 0–44)
AST: 12 IU/L (ref 0–40)
Albumin/Globulin Ratio: 1.5 (ref 1.2–2.2)
Albumin: 4.1 g/dL (ref 3.8–4.9)
Alkaline Phosphatase: 147 IU/L — ABNORMAL HIGH (ref 44–121)
BUN/Creatinine Ratio: 11 (ref 9–20)
BUN: 11 mg/dL (ref 6–24)
Bilirubin Total: 0.2 mg/dL (ref 0.0–1.2)
CO2: 24 mmol/L (ref 20–29)
Calcium: 9.5 mg/dL (ref 8.7–10.2)
Chloride: 102 mmol/L (ref 96–106)
Creatinine, Ser: 1 mg/dL (ref 0.76–1.27)
GFR calc Af Amer: 100 mL/min/{1.73_m2} (ref >59)
GFR calc non Af Amer: 86 mL/min/{1.73_m2} (ref >59)
Globulin, Total: 2.7 g/dL (ref 1.5–4.5)
Glucose: 270 mg/dL — ABNORMAL HIGH (ref 65–99)
Potassium: 4.2 mmol/L (ref 3.5–5.2)
Sodium: 139 mmol/L (ref 134–144)
Total Protein: 6.8 g/dL (ref 6.0–8.5)

## 2021-01-15 LAB — CBC WITH DIFFERENTIAL/PLATELET
Basophils Absolute: 0 10*3/uL (ref 0.0–0.2)
Basos: 1 %
EOS (ABSOLUTE): 0.3 10*3/uL (ref 0.0–0.4)
Eos: 4 %
Hematocrit: 46.3 % (ref 37.5–51.0)
Hemoglobin: 14.9 g/dL (ref 13.0–17.7)
Immature Grans (Abs): 0 10*3/uL (ref 0.0–0.1)
Immature Granulocytes: 1 %
Lymphocytes Absolute: 2.5 10*3/uL (ref 0.7–3.1)
Lymphs: 35 %
MCH: 26.6 pg (ref 26.6–33.0)
MCHC: 32.2 g/dL (ref 31.5–35.7)
MCV: 83 fL (ref 79–97)
Monocytes Absolute: 0.4 10*3/uL (ref 0.1–0.9)
Monocytes: 6 %
Neutrophils Absolute: 3.7 10*3/uL (ref 1.4–7.0)
Neutrophils: 53 %
Platelets: 281 10*3/uL (ref 150–450)
RBC: 5.6 x10E6/uL (ref 4.14–5.80)
RDW: 14 % (ref 11.6–15.4)
WBC: 7 10*3/uL (ref 3.4–10.8)

## 2021-01-15 LAB — MICROSCOPIC EXAMINATION
Bacteria, UA: NONE SEEN
Casts: NONE SEEN /lpf
Epithelial Cells (non renal): NONE SEEN /hpf (ref 0–10)
RBC: NONE SEEN /hpf (ref 0–2)

## 2021-01-15 LAB — LIPID PANEL
Chol/HDL Ratio: 4.6 ratio (ref 0.0–5.0)
Cholesterol, Total: 171 mg/dL (ref 100–199)
HDL: 37 mg/dL — ABNORMAL LOW (ref >39)
LDL Chol Calc (NIH): 121 mg/dL — ABNORMAL HIGH (ref 0–99)
Triglycerides: 65 mg/dL (ref 0–149)
VLDL Cholesterol Cal: 13 mg/dL (ref 5–40)

## 2021-01-15 LAB — UA/M W/RFLX CULTURE, ROUTINE
Bilirubin, UA: NEGATIVE
Ketones, UA: NEGATIVE
Leukocytes,UA: NEGATIVE
Nitrite, UA: NEGATIVE
Protein,UA: NEGATIVE
RBC, UA: NEGATIVE
Specific Gravity, UA: >=1.03 — AB (ref 1.005–1.030)
Urobilinogen, Ur: 1 mg/dL (ref 0.2–1.0)
pH, UA: 6 (ref 5.0–7.5)

## 2021-01-15 LAB — TSH: TSH: 1.08 u[IU]/mL (ref 0.450–4.500)

## 2021-01-15 LAB — HEPATITIS C ANTIBODY: Hep C Virus Ab: <0.1 s/co ratio (ref 0.0–0.9)

## 2021-01-15 LAB — HIV ANTIBODY (ROUTINE TESTING W REFLEX): HIV Screen 4th Generation wRfx: NONREACTIVE

## 2021-01-15 LAB — HEMOGLOBIN A1C
Est. average glucose Bld gHb Est-mCnc: 278 mg/dL
Hgb A1c MFr Bld: 11.3 % — ABNORMAL HIGH (ref 4.8–5.6)

## 2021-01-15 LAB — VITAMIN D 25 HYDROXY (VIT D DEFICIENCY, FRACTURES): Vit D, 25-Hydroxy: 11.5 ng/mL — ABNORMAL LOW (ref 30.0–100.0)

## 2021-01-17 ENCOUNTER — Other Ambulatory Visit: Payer: Self-pay | Admitting: *Deleted

## 2021-01-17 ENCOUNTER — Other Ambulatory Visit (HOSPITAL_COMMUNITY)
Admission: RE | Admit: 2021-01-17 | Discharge: 2021-01-17 | Disposition: A | Discharge status: Home / Self Care | Payer: 59 | Source: Ambulatory Visit | Attending: Internal Medicine | Admitting: Internal Medicine

## 2021-01-17 ENCOUNTER — Other Ambulatory Visit: Payer: Self-pay | Admitting: Internal Medicine

## 2021-01-17 DIAGNOSIS — E559 Vitamin D deficiency, unspecified: Secondary | ICD-10-CM

## 2021-01-17 DIAGNOSIS — E785 Hyperlipidemia, unspecified: Secondary | ICD-10-CM

## 2021-01-17 DIAGNOSIS — N342 Other urethritis: Secondary | ICD-10-CM | POA: Insufficient documentation

## 2021-01-17 DIAGNOSIS — E1165 Type 2 diabetes mellitus with hyperglycemia: Secondary | ICD-10-CM

## 2021-01-17 LAB — GC/CHLAMYDIA PROBE AMP
Chlamydia trachomatis, NAA: NEGATIVE
Neisseria Gonorrhoeae by PCR: NEGATIVE

## 2021-01-17 MED ORDER — METFORMIN HCL 500 MG PO TABS: 500.0000 mg | ORAL_TABLET | Freq: Two times a day (BID) | ORAL | 1 refills | Status: DC

## 2021-01-17 MED ORDER — VITAMIN D (ERGOCALCIFEROL) 1.25 MG (50000 UNIT) PO CAPS: 50000.0000 [IU] | ORAL_CAPSULE | ORAL | 5 refills | Status: AC

## 2021-01-17 MED ORDER — ROSUVASTATIN CALCIUM 10 MG PO TABS: 10.0000 mg | ORAL_TABLET | Freq: Every day | ORAL | 1 refills | Status: DC

## 2021-01-17 MED ORDER — GLIPIZIDE 10 MG PO TABS: 10.0000 mg | ORAL_TABLET | Freq: Two times a day (BID) | ORAL | 3 refills | Status: DC

## 2021-01-17 NOTE — Progress Notes (Signed)
cytolog

## 2021-01-18 LAB — CYTOLOGY, (ORAL, ANAL, URETHRAL) ANCILLARY ONLY
Chlamydia: NEGATIVE
Comment: NEGATIVE
Comment: NEGATIVE
Comment: NORMAL
Neisseria Gonorrhea: NEGATIVE
Trichomonas: NEGATIVE

## 2021-02-11 ENCOUNTER — Ambulatory Visit: Payer: 59 | Admitting: Internal Medicine

## 2021-09-02 ENCOUNTER — Encounter: Payer: Self-pay | Admitting: Emergency Medicine

## 2021-09-02 ENCOUNTER — Ambulatory Visit
Admission: EM | Admit: 2021-09-02 | Discharge: 2021-09-02 | Disposition: A | Discharge status: Home / Self Care | Payer: 59 | Attending: Emergency Medicine | Admitting: Emergency Medicine

## 2021-09-02 DIAGNOSIS — E119 Type 2 diabetes mellitus without complications: Secondary | ICD-10-CM | POA: Insufficient documentation

## 2021-09-02 DIAGNOSIS — N481 Balanitis: Secondary | ICD-10-CM

## 2021-09-02 DIAGNOSIS — I10 Essential (primary) hypertension: Secondary | ICD-10-CM | POA: Insufficient documentation

## 2021-09-02 HISTORY — DX: Type 2 diabetes mellitus without complications: E11.9

## 2021-09-02 HISTORY — DX: Essential (primary) hypertension: I10

## 2021-09-02 MED ORDER — CLOTRIMAZOLE 1 % EX CREA: TOPICAL_CREAM | CUTANEOUS | 0 refills | Status: AC

## 2021-09-02 MED ORDER — FLUCONAZOLE 200 MG PO TABS: ORAL_TABLET | ORAL | 0 refills | Status: DC

## 2021-09-02 NOTE — Discharge Instructions (Signed)
Ensure genital cleansing with retraction of foreskin Twice daily bathing of the affected area with saline solution can be helpful Clotrimazole 1% cream prescribed.  Use twice daily for 7-14 days Prescribed diflucan 200 mg once daily and then second dose 72 hours later Follow up with PCP for recheck Return or go to the ER if you have any new or worsening symptoms such as pain, increased redness or swelling of foreskin, inability to retract foreskin, etc..Marland Kitchen

## 2021-09-02 NOTE — ED Provider Notes (Signed)
[facility]   [id number] 09/02/21 Arrival Time: 1139   [id number] itching  SUBJECTIVE:  Marcus Valencia is a 54 y.o. male who presents requesting penile itching x 1 week.  Denies precipitating event or trauma.  Sexually active with 1 male over the past 2 years.  Has tried gold bond without relief.  Denies similar symptoms in the past.  Complains of associated white discharge on penis.  Denies fever, chills, nausea, vomiting, abdominal or pelvic pain, testicular swelling or pain.     Hx significant for DM.     No LMP for male patient.  ROS: As per HPI.  All other pertinent ROS negative.     Past Medical History:  Diagnosis Date   Diabetes (HCC)    Hypertension    History reviewed. No pertinent surgical history. No Known Allergies No current facility-administered medications on file prior to encounter.   Current Outpatient Medications on File Prior to Encounter  Medication Sig Dispense Refill   glipiZIDE (GLUCOTROL) 10 MG tablet Take 1 tablet (10 mg total) by mouth 2 (two) times daily before a meal. 60 tablet 3   metFORMIN (GLUCOPHAGE) 500 MG tablet Take 1 tablet (500 mg total) by mouth 2 (two) times daily with a meal. 180 tablet 1   rosuvastatin (CRESTOR) 10 MG tablet Take 1 tablet (10 mg total) by mouth daily. 90 tablet 1   Vitamin D, Ergocalciferol, (DRISDOL) 1.25 MG (50000 UNIT) CAPS capsule Take 1 capsule (50,000 Units total) by mouth every 7 (seven) days. 5 capsule 5   Social History   Socioeconomic History   Marital status: Legally Separated    Spouse name: Not on file   Number of children: Not on file   Years of education: Not on file   Highest education level: Not on file  Occupational History   Not on file  Tobacco Use   Smoking status: Former    Types: Cigarettes    Quit date: 12/06/2006    Years since quitting: 14.7   Smokeless tobacco: Never  Substance and Sexual Activity   Alcohol use: Yes    Alcohol/week: 3.0 standard drinks    Types: 1  Cans of beer, 2 Shots of liquor per week    Comment: occ   Drug use: No   Sexual activity: Not on file  Other Topics Concern   Not on file  Social History Narrative   Not on file   Social Determinants of Health   Financial Resource Strain: Not on file  Food Insecurity: Not on file  Transportation Needs: Not on file  Physical Activity: Not on file  Stress: Not on file  Social Connections: Not on file  Intimate Partner Violence: Not on file   History reviewed. No pertinent family history.  OBJECTIVE:  Vitals:   09/02/21 1246  BP: (!) 157/86  Pulse: 89  Resp: 20  Temp: 98.4 F (36.9 C)  TempSrc: Oral  SpO2: 96%     General appearance: alert, NAD, appears stated age Head: NCAT Throat: lips, mucosa, and tongue normal; teeth and gums normal Lungs: normal respiratory effort GU: Tinea Rager RN present as chaperone.  circumcised male; appears uncircumcised, white discharge on shaft of penis, erythema and swelling around glans penis with foreskin is retracted Skin: warm and dry Psychological:  Alert and cooperative. Normal mood and affect.  ASSESSMENT & PLAN:  1. Balanitis     Meds ordered this encounter  Medications   fluconazole (DIFLUCAN) 200 MG tablet    Sig:  Take one dose by mouth, wait 72 hours, and then take second dose by mouth    Dispense:  2 tablet    Refill:  0    Order Specific Question:   Supervising Provider    Answer:   Eustace Moore [[id number]]   clotrimazole (LOTRIMIN) 1 % cream    Sig: Apply to affected area 2 times daily    Dispense:  15 g    Refill:  0    Order Specific Question:   Supervising Provider    Answer:   Eustace Moore [[id number]]    Pending: Labs Reviewed - No data to display  Ensure genital cleansing with retraction of foreskin Twice daily bathing of the affected area with saline solution can be helpful Clotrimazole 1% cream prescribed.  Use twice daily for 7-14 days Prescribed diflucan 200 mg once daily and then second  dose 72 hours later Follow up with PCP for recheck Return or go to the ER if you have any new or worsening symptoms such as pain, increased redness or swelling of foreskin, inability to retract foreskin, etc...   Reviewed expectations re: course of current medical issues. Questions answered. Outlined signs and symptoms indicating need for more acute intervention. Patient verbalized understanding. After Visit Summary given.        Rennis Harding, PA-C 09/02/21 1323

## 2021-09-02 NOTE — ED Triage Notes (Signed)
Pt presents today with c/o of penile itching x 1 week. He has been applying gold bond to area. Denies penile discharge or burning.

## 2022-04-24 ENCOUNTER — Ambulatory Visit
Admission: EM | Admit: 2022-04-24 | Discharge: 2022-04-24 | Disposition: A | Discharge status: Home / Self Care | Payer: 59 | Attending: Nurse Practitioner | Admitting: Nurse Practitioner

## 2022-04-24 DIAGNOSIS — Z760 Encounter for issue of repeat prescription: Secondary | ICD-10-CM | POA: Diagnosis not present

## 2022-04-24 DIAGNOSIS — E1165 Type 2 diabetes mellitus with hyperglycemia: Secondary | ICD-10-CM

## 2022-04-24 DIAGNOSIS — R600 Localized edema: Secondary | ICD-10-CM | POA: Diagnosis not present

## 2022-04-24 DIAGNOSIS — E785 Hyperlipidemia, unspecified: Secondary | ICD-10-CM

## 2022-04-24 LAB — POCT FASTING CBG KUC MANUAL ENTRY: POCT Glucose (KUC): 273 mg/dL — AB (ref 70–99)

## 2022-04-24 MED ORDER — FUROSEMIDE 20 MG PO TABS: 40.0000 mg | ORAL_TABLET | Freq: Every day | ORAL | 0 refills | Status: DC

## 2022-04-24 MED ORDER — ROSUVASTATIN CALCIUM 10 MG PO TABS: 10.0000 mg | ORAL_TABLET | Freq: Every day | ORAL | 0 refills | Status: DC

## 2022-04-24 MED ORDER — METFORMIN HCL 500 MG PO TABS: 500.0000 mg | ORAL_TABLET | Freq: Two times a day (BID) | ORAL | 0 refills | Status: DC

## 2022-04-24 MED ORDER — GLIPIZIDE 10 MG PO TABS: 10.0000 mg | ORAL_TABLET | Freq: Two times a day (BID) | ORAL | 0 refills | Status: DC

## 2022-04-24 NOTE — Discharge Instructions (Signed)
Take medication as prescribed. ?You will need to follow up with Dr. Allena Katz within the next 5-7 days or sooner if you can get an appointment. ?Signs of hyperglycemia include increased thirst, increase hunger and increase urination.  ?Follow-up if symptoms worsen or if you have other concerns. ?

## 2022-04-24 NOTE — ED Provider Notes (Signed)
?[place] ? ? ? ?CSN: [id number] ?Arrival date & time: 04/24/22  [id number] ? ? ?  ? ?History   ?Chief Complaint ?Chief Complaint  ?Patient presents with  ? Foot Swelling  ? ? ?HPI ?ANIRVIN SLOWE is a 56 y.o. male.  ? ?Patient is a 56 year old male who presents with left lower extremity swelling.  He states symptoms started approximately 2 weeks ago.  He denies any previous injury or trauma.  He denies chest pain, shortness of breath, difficulty breathing, or other concerns.  Patient reports he has a history of diabetes, but denies any other past medical history.  His chart does reveal he has a history of diabetes, hypertension, and is currently on medications for hyperlipidemia.  Patient reports that he has not taken his diabetes medication in over 6 months because of side effects. ? ?The history is provided by the patient.  ? ?Past Medical History:  ?Diagnosis Date  ? Diabetes ([place])   ? Hypertension   ? ? ?Patient Active Problem List  ? Diagnosis Date Noted  ? Hypertension 09/02/2021  ? Diabetes ([place]) 09/02/2021  ? Encounter to establish care 01/14/2021  ? Urethritis 01/14/2021  ? Morbid obesity ([place]) 01/14/2021  ? Elevated BP without diagnosis of hypertension 01/14/2021  ? ? ?History reviewed. No pertinent surgical history. ? ? ? ? ?Home Medications   ? ?Prior to Admission medications   ?Medication Sig Start Date End Date Taking? Authorizing Provider  ?furosemide (LASIX) 20 MG tablet Take 2 tablets (40 mg total) by mouth daily for 5 days. 04/24/22 04/29/22 Yes Denean Pavon-Warren, Alda Lea, NP  ?clotrimazole (LOTRIMIN) 1 % cream Apply to affected area 2 times daily 09/02/21   Wurst, [place], PA-C  ?fluconazole (DIFLUCAN) 200 MG tablet Take one dose by mouth, wait 72 hours, and then take second dose by mouth 09/02/21   Wurst, [place], PA-C  ?glipiZIDE (GLUCOTROL) 10 MG tablet Take 1 tablet (10 mg total) by mouth 2 (two) times daily before a meal. 04/24/22   Gerrie Castiglia-Warren, Alda Lea, NP  ?metFORMIN (GLUCOPHAGE) 500  MG tablet Take 1 tablet (500 mg total) by mouth 2 (two) times daily with a meal. 04/24/22 05/24/22  Analisse Randle-Warren, Alda Lea, NP  ?rosuvastatin (CRESTOR) 10 MG tablet Take 1 tablet (10 mg total) by mouth daily. 04/24/22   Mahum Betten-Warren, Alda Lea, NP  ?Vitamin D, Ergocalciferol, (DRISDOL) 1.25 MG (50000 UNIT) CAPS capsule Take 1 capsule (50,000 Units total) by mouth every 7 (seven) days. 01/17/21   Lindell Spar, MD  ? ? ?Family History ?History reviewed. No pertinent family history. ? ?Social History ?Social History  ? ?Tobacco Use  ? Smoking status: Former  ?  Types: Cigarettes  ?  Quit date: 12/06/2006  ?  Years since quitting: 15.3  ? Smokeless tobacco: Never  ?Substance Use Topics  ? Alcohol use: Yes  ?  Alcohol/week: 3.0 standard drinks  ?  Types: 1 Cans of beer, 2 Shots of liquor per week  ?  Comment: occ  ? Drug use: No  ? ? ? ?Allergies   ?Patient has no known allergies. ? ? ?Review of Systems ?Review of Systems  ?Constitutional: Negative.   ?Respiratory: Negative.    ?Cardiovascular:  Positive for leg swelling. Palpitations: bilateral, worse in the RLE. ?Gastrointestinal: Negative.   ?Skin:  Positive for color change (bilateral lower extremities).  ?Psychiatric/Behavioral: Negative.    ? ? ?Physical Exam ?Triage Vital Signs ?ED Triage Vitals  ?Enc Vitals Group  ?   BP 04/24/22 0911 Marland Kitchen)  145/87  ?   Pulse Rate 04/24/22 0911 77  ?   Resp 04/24/22 0911 20  ?   Temp 04/24/22 0911 98.6 ?F (37 ?C)  ?   Temp src --   ?   SpO2 04/24/22 0911 95 %  ?   Weight --   ?   Height --   ?   Head Circumference --   ?   Peak Flow --   ?   Pain Score 04/24/22 0909 0  ?   Pain Loc --   ?   Pain Edu? --   ?   Excl. in [place]? --   ? ?No data found. ? ?Updated Vital Signs ?BP (!) 145/87   Pulse 77   Temp 98.6 ?F (37 ?C)   Resp 20   SpO2 95%  ? ?Visual Acuity ?Right Eye Distance:   ?Left Eye Distance:   ?Bilateral Distance:   ? ?Right Eye Near:   ?Left Eye Near:    ?Bilateral Near:    ? ?Physical Exam ?Vitals and nursing note reviewed.   ?Constitutional:   ?   Appearance: He is obese.  ?HENT:  ?   Head: Normocephalic and atraumatic.  ?   Right Ear: Tympanic membrane, ear canal and external ear normal.  ?   Left Ear: Tympanic membrane, ear canal and external ear normal.  ?   Nose: Nose normal.  ?   Mouth/Throat:  ?   Mouth: Mucous membranes are moist.  ?Eyes:  ?   Extraocular Movements: Extraocular movements intact.  ?   Conjunctiva/sclera: Conjunctivae normal.  ?   Pupils: Pupils are equal, round, and reactive to light.  ?Cardiovascular:  ?   Rate and Rhythm: Normal rate and regular rhythm.  ?   Pulses: Normal pulses.  ?   Heart sounds: Normal heart sounds.  ?Pulmonary:  ?   Effort: Pulmonary effort is normal.  ?   Breath sounds: Normal breath sounds.  ?Abdominal:  ?   General: Bowel sounds are normal.  ?   Palpations: Abdomen is soft.  ?   Tenderness: There is no abdominal tenderness.  ?Musculoskeletal:     ?   General: No deformity.  ?   Cervical back: Normal range of motion.  ?   Right lower leg: 3+ Pitting Edema present.  ?   Left lower leg: 2+ Pitting Edema present.  ?Skin: ?   General: Skin is warm and dry.  ?   Capillary Refill: Capillary refill takes less than 2 seconds.  ?   Comments: Bilateral lower extremities with discoloration, no redness or erythema, + hyperpigmentation noted  ?Neurological:  ?   General: No focal deficit present.  ?   Mental Status: He is alert and oriented to person, place, and time.  ?Psychiatric:     ?   Mood and Affect: Mood normal.     ?   Behavior: Behavior normal.  ? ? ? ?UC Treatments / Results  ?Labs ?(all labs ordered are listed, but only abnormal results are displayed) ?Labs Reviewed  ?POCT FASTING [facility] MANUAL ENTRY - Abnormal; Notable for the following components:  ?    Result Value  ? POCT Glucose ([facility]) 273 (*)   ? All other components within normal limits  ? ? ?EKG ? ? ?Radiology ?No results found. ? ?Procedures ?Procedures (including critical care time) ? ?Medications Ordered in UC ?Medications - No  data to display ? ?Initial Impression / Assessment and Plan / UC Course  ?  I have reviewed the triage vital signs and the nursing notes. ? ?Pertinent labs & imaging results that were available during my care of the patient were reviewed by me and considered in my medical decision making (see chart for details). ? ?The patient is a 56 year old male who presents with left lower extremity edema.  On exam, the patient has +3 pitting edema.  Edema is greater in the left lower extremity.  There are no signs of infection to include redness, warmth, or any drainage.  He has not seen his doctor in several months, recommend follow-up to rule out congestive heart failure and peripheral vascular disease based on his exam today.  He does have discoloration in his bilateral lower extremities as well.  His blood glucose was 273 fasting in the office today.  Patient advised that he also needs to follow-up with his PCP regarding his blood glucose and starting a new medication if he is having medication side effects.  Patient was advised that we will start him on Lasix for 5 days.  Advised that there is no infection, and he has not had injury or trauma, but that this is most likely fluid retention.  Patient advised that he will need to follow-up with his PCP within the next 5 to 7 days for further evaluation.  Medication refills were also provided today on his rosuvastatin, glipizide, and metformin.  Patient advised to make dietary modifications.  Follow-up as needed. ?Final Clinical Impressions(s) / UC Diagnoses  ? ?Final diagnoses:  ?Uncontrolled type 2 diabetes mellitus with hyperglycemia ([place])  ?Bilateral lower extremity edema  ?Encounter for medication refill  ? ? ? ?Discharge Instructions   ? ?  ?Take medication as prescribed. ?You will need to follow up with Dr. Posey Pronto within the next 5-7 days or sooner if you can get an appointment. ?Signs of hyperglycemia include increased thirst, increase hunger and increase urination.   ?Follow-up if symptoms worsen or if you have other concerns. ? ? ? ? ?ED Prescriptions   ? ? Medication Sig Dispense Auth. Provider  ? furosemide (LASIX) 20 MG tablet Take 2 tablets (40 mg total) by mouth da

## 2022-04-24 NOTE — ED Triage Notes (Signed)
Pt presents with left foot and leg swelling for past 2 weeks, pt denies pain currently. States he has not been taking metformin as prescribed  ?

## 2022-05-03 ENCOUNTER — Ambulatory Visit (INDEPENDENT_AMBULATORY_CARE_PROVIDER_SITE_OTHER): Payer: 59 | Admitting: Family Medicine

## 2022-05-03 ENCOUNTER — Encounter: Payer: Self-pay | Admitting: Family Medicine

## 2022-05-03 VITALS — BP 132/86 | HR 84 | Ht 70.0 in | Wt 360.2 lb

## 2022-05-03 DIAGNOSIS — M7989 Other specified soft tissue disorders: Secondary | ICD-10-CM | POA: Diagnosis not present

## 2022-05-03 DIAGNOSIS — Z1211 Encounter for screening for malignant neoplasm of colon: Secondary | ICD-10-CM | POA: Diagnosis not present

## 2022-05-03 MED ORDER — FUROSEMIDE 20 MG PO TABS: 40.0000 mg | ORAL_TABLET | Freq: Every day | ORAL | 0 refills | Status: DC

## 2022-05-03 NOTE — Progress Notes (Signed)
? ?Established Patient Office Visit ? ?Subjective:  ?Patient ID: Marcus Valencia, male    DOB: November 28, 1966  Age: 56 y.o. MRN: [id number] ? ?CC:  ?Chief Complaint  ?Patient presents with  ? Foot Swelling  ?  Pt has some swelling in his feet, more on the left since 04/12/22. Went to Urgent care they gave him fluid pills that were not effective.   ? ? ?HPI ?Marcus Valencia is a 56 y.o. male with past medical history of hypertension and diabetes presents with complaints of lower extremities edema. Onset of symptoms was 3 weeks ago. He denies burning, aching, pain, or a tingling sensation in the legs. He denies claudication. He notes that the swelling is worst at night time. 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 He performs leg elevation once daily with minor relief. He reports cutting back on his salt intake.  ? ?He was seen in the ED on 04/24/22 with a complaint of bilateral leg swelling and was treated with Lasix for five days. The patient reports minor relief of symptoms.  ? ? ?Past Medical History:  ?Diagnosis Date  ? Diabetes ([place])   ? Hypertension   ? ? ?History reviewed. No pertinent surgical history. ? ?History reviewed. No pertinent family history. ? ?Social History  ? ?Socioeconomic History  ? Marital status: Legally Separated  ?  Spouse name: Not on file  ? Number of children: Not on file  ? Years of education: Not on file  ? Highest education level: Not on file  ?Occupational History  ? Not on file  ?Tobacco Use  ? Smoking status: Former  ?  Types: Cigarettes  ?  Quit date: 12/06/2006  ?  Years since quitting: 15.4  ? Smokeless tobacco: Never  ?Substance and Sexual Activity  ? Alcohol use: Yes  ?  Alcohol/week: 3.0 standard drinks  ?  Types: 1 Cans of beer, 2 Shots of liquor per week  ?  Comment: occ  ? Drug use: No  ? Sexual activity: Not on file  ?Other Topics Concern  ? Not on file  ?Social History Narrative  ? Not on file  ? ?Social Determinants of Health   ? ?Financial Resource Strain: Not on file  ?Food Insecurity: Not on file  ?Transportation Needs: Not on file  ?Physical Activity: Not on file  ?Stress: Not on file  ?Social Connections: Not on file  ?Intimate Partner Violence: Not on file  ? ? ?Outpatient Medications Prior to Visit  ?Medication Sig Dispense Refill  ? glipiZIDE (GLUCOTROL) 10 MG tablet Take 1 tablet (10 mg total) by mouth 2 (two) times daily before a meal. 60 tablet 0  ? metFORMIN (GLUCOPHAGE) 500 MG tablet Take 1 tablet (500 mg total) by mouth 2 (two) times daily with a meal. 60 tablet 0  ? rosuvastatin (CRESTOR) 10 MG tablet Take 1 tablet (10 mg total) by mouth daily. 30 tablet 0  ? clotrimazole (LOTRIMIN) 1 % cream Apply to affected area 2 times daily (Patient not taking: Reported on 05/03/2022) 15 g 0  ? fluconazole (DIFLUCAN) 200 MG tablet Take one dose by mouth, wait 72 hours, and then take second dose by mouth (Patient not taking: Reported on 05/03/2022) 2 tablet 0  ? Vitamin D, Ergocalciferol, (DRISDOL) 1.25 MG (50000 UNIT) CAPS capsule Take 1 capsule (50,000 Units total) by mouth every 7 (seven) days. (Patient not taking: Reported on 05/03/2022) 5 capsule 5  ? furosemide (LASIX) 20 MG  tablet Take 2 tablets (40 mg total) by mouth daily for 5 days. 10 tablet 0  ? ?No facility-administered medications prior to visit.  ? ? ?No Known Allergies ? ?ROS ?Review of Systems  ?Constitutional:  Negative for diaphoresis, fatigue and fever.  ?HENT:  Negative for sinus pressure, sinus pain and sneezing.   ?Eyes:  Negative for photophobia, redness and visual disturbance.  ?Respiratory:  Negative for cough and shortness of breath.   ?Cardiovascular:  Positive for leg swelling. Negative for chest pain and palpitations.  ?Gastrointestinal:  Negative for nausea, rectal pain and vomiting.  ?Endocrine: Negative for polydipsia, polyphagia and polyuria.  ?Musculoskeletal:  Negative for back pain and neck pain.  ?Skin:  Negative for rash.  ?Neurological:  Negative for  dizziness, tremors, weakness and headaches.  ?Psychiatric/Behavioral:  Negative for confusion and suicidal ideas.   ? ?  ?Objective:  ?  ?Physical Exam ?HENT:  ?   Head: Normocephalic.  ?   Right Ear: External ear normal.  ?   Left Ear: External ear normal.  ?Cardiovascular:  ?   Rate and Rhythm: Normal rate and regular rhythm.  ?   Heart sounds: Normal heart sounds.  ?Pulmonary:  ?   Effort: Pulmonary effort is normal.  ?   Breath sounds: Normal breath sounds.  ?Musculoskeletal:  ?   Cervical back: No rigidity.  ?   Right lower leg: 1+ Edema present.  ?   Left lower leg: 2+ Edema present.  ?Skin: ?   General: Skin is warm.  ?   Findings: No lesion.  ?Neurological:  ?   Mental Status: He is alert.  ? ? ?BP 132/86   Pulse 84   Ht 5\' 10"  (1.778 m)   Wt (!) 360 lb 3.2 oz (163.4 kg)   SpO2 97%   BMI 51.68 kg/m?  ?Wt Readings from Last 3 Encounters:  ?05/03/22 (!) 360 lb 3.2 oz (163.4 kg)  ?01/14/21 (!) 358 lb (162.4 kg)  ?08/11/18 (!) 362 lb (164.2 kg)  ? ? ?Lab Results  ?Component Value Date  ? TSH 1.080 01/14/2021  ? ?Lab Results  ?Component Value Date  ? WBC 7.0 01/14/2021  ? HGB 14.9 01/14/2021  ? HCT 46.3 01/14/2021  ? MCV 83 01/14/2021  ? PLT 281 01/14/2021  ? ?Lab Results  ?Component Value Date  ? NA 139 01/14/2021  ? K 4.2 01/14/2021  ? CO2 24 01/14/2021  ? GLUCOSE 270 (H) 01/14/2021  ? BUN 11 01/14/2021  ? CREATININE 1.00 01/14/2021  ? BILITOT 0.2 01/14/2021  ? ALKPHOS 147 (H) 01/14/2021  ? AST 12 01/14/2021  ? ALT 20 01/14/2021  ? PROT 6.8 01/14/2021  ? ALBUMIN 4.1 01/14/2021  ? CALCIUM 9.5 01/14/2021  ? ?Lab Results  ?Component Value Date  ? CHOL 171 01/14/2021  ? ?Lab Results  ?Component Value Date  ? HDL 37 (L) 01/14/2021  ? ?Lab Results  ?Component Value Date  ? [place] 121 (H) 01/14/2021  ? ?Lab Results  ?Component Value Date  ? TRIG 65 01/14/2021  ? ?Lab Results  ?Component Value Date  ? CHOLHDL 4.6 01/14/2021  ? ?Lab Results  ?Component Value Date  ? HGBA1C 11.3 (H) 01/14/2021  ? ? ?  ?Assessment &  Plan:  ? ?Problem List Items Addressed This Visit   ? ?  ? Other  ? Leg swelling  ?  Bilateral leg swelling ?Will reorder a two-week supply of lasix and assess for swelling in two weeks ? Recommend conservative managements  to help decrease swelling in your hands and feet.   ?-Reduce salt (sodium) in your diet -- Sodium, which is found in table salt and processed foods, can worsen edema. Reducing the amount of salt you consume can help to reduce edema, especially if you also take a diuretic.  ?-Compression stockings -- Leg edema can be prevented and treated with the use of compression stockings. Stockings are available in several heights, including knee-high, thigh-high, and pantyhose. Knee-high stockings are sufficient for most patients. ?- Body positioning -- Leg, ankle, and foot edema can be improved by elevating the legs above heart level for 30 minutes three or four times per day. Elevating the legs may be sufficient to reduce or eliminate edema ? ?  ?  ? ?Other Visit Diagnoses   ? ? Colon cancer screening    -  Primary  ? Relevant Orders  ? Ambulatory referral to Gastroenterology  ? ?  ? ? ?Meds ordered this encounter  ?Medications  ? furosemide (LASIX) 20 MG tablet  ?  Sig: Take 2 tablets (40 mg total) by mouth daily for 14 days.  ?  Dispense:  28 tablet  ?  Refill:  0  ? ? ?Follow-up: Return in about 2 weeks (around 05/17/2022) for lower extremities swelling.  ? ? ?Alvira Monday, FNP ?

## 2022-05-03 NOTE — Assessment & Plan Note (Signed)
Bilateral leg swelling ?Will reorder a two-week supply of lasix and assess for swelling in two weeks ? Recommend conservative managements to help decrease swelling in your hands and feet.   ?-Reduce salt (sodium) in your diet -- Sodium, which is found in table salt and processed foods, can worsen edema. Reducing the amount of salt you consume can help to reduce edema, especially if you also take a diuretic.  ?-Compression stockings -- Leg edema can be prevented and treated with the use of compression stockings. Stockings are available in several heights, including knee-high, thigh-high, and pantyhose. Knee-high stockings are sufficient for most patients. ?- Body positioning -- Leg, ankle, and foot edema can be improved by elevating the legs above heart level for 30 minutes three or four times per day. Elevating the legs may be sufficient to reduce or eliminate edema ? ?

## 2022-05-03 NOTE — Patient Instructions (Addendum)
I appreciate the opportunity to provide care to you today! ? ?  ?Follow up:  2 weeks ? Please pick up your medication at the pharmacy ? ? Recommend conservative managements to help decrease swelling in your hands and feet.   ?-Reduce salt (sodium) in your diet -- Sodium, which is found in table salt and processed foods, can worsen edema. Reducing the amount of salt you consume can help to reduce edema, especially if you also take a diuretic.  ?-Compression stockings -- Leg edema can be prevented and treated with the use of compression stockings. Stockings are available in several heights, including knee-high, thigh-high, and pantyhose. Knee-high stockings are sufficient for most patients. ?- Body positioning -- Leg, ankle, and foot edema can be improved by elevating the legs above heart level for 30 minutes three or four times per day. Elevating the legs may be sufficient to reduce or eliminate edema  ? ?  ?Please continue to a heart-healthy diet and increase your physical activities. Try to exercise for at least three times a week.  ? ? ?  ?It was a pleasure to see you and I look forward to continuing to work together on your health and well-being. ?Please do not hesitate to call the office if you need care or have questions about your care. ?  ?Have a wonderful day and week. ?With Gratitude, ?Gilmore Laroche MSN, FNP-BC' ? ?

## 2022-05-16 ENCOUNTER — Encounter: Payer: Self-pay | Admitting: *Deleted

## 2022-05-22 ENCOUNTER — Ambulatory Visit (INDEPENDENT_AMBULATORY_CARE_PROVIDER_SITE_OTHER): Payer: 59 | Admitting: Internal Medicine

## 2022-05-22 ENCOUNTER — Encounter: Payer: Self-pay | Admitting: Internal Medicine

## 2022-05-22 VITALS — BP 128/88 | HR 86 | Resp 18 | Ht 70.0 in | Wt 363.4 lb

## 2022-05-22 DIAGNOSIS — M7989 Other specified soft tissue disorders: Secondary | ICD-10-CM

## 2022-05-22 DIAGNOSIS — E559 Vitamin D deficiency, unspecified: Secondary | ICD-10-CM

## 2022-05-22 DIAGNOSIS — E785 Hyperlipidemia, unspecified: Secondary | ICD-10-CM

## 2022-05-22 DIAGNOSIS — E1165 Type 2 diabetes mellitus with hyperglycemia: Secondary | ICD-10-CM | POA: Diagnosis not present

## 2022-05-22 MED ORDER — METFORMIN HCL 500 MG PO TABS: 500.0000 mg | ORAL_TABLET | Freq: Two times a day (BID) | ORAL | 2 refills | Status: AC

## 2022-05-22 MED ORDER — GLIPIZIDE 10 MG PO TABS: 10.0000 mg | ORAL_TABLET | Freq: Two times a day (BID) | ORAL | 2 refills | Status: AC

## 2022-05-22 MED ORDER — FUROSEMIDE 20 MG PO TABS: 20.0000 mg | ORAL_TABLET | Freq: Every day | ORAL | 2 refills | Status: AC

## 2022-05-22 MED ORDER — ROSUVASTATIN CALCIUM 10 MG PO TABS: 10.0000 mg | ORAL_TABLET | Freq: Every day | ORAL | 2 refills | Status: AC

## 2022-05-22 NOTE — Assessment & Plan Note (Signed)
On Crestor, advised to stay compliant

## 2022-05-22 NOTE — Assessment & Plan Note (Signed)
Lab Results  Component Value Date   HGBA1C 11.3 (H) 01/14/2021    On metformin 500 mg twice daily and glipizide 10 mg twice daily, noncompliant Advised to follow diabetic diet On statin F/u CMP and lipid panel Diabetic eye exam: Advised to follow up with Ophthalmology for diabetic eye exam

## 2022-05-22 NOTE — Patient Instructions (Signed)
Please start taking Furosemide 20 mg once daily for leg swelling.  Please use compression socks and perform leg elevation as tolerated.  Please continue taking other medications as prescribed.  Please follow DASH diet and ambulate as tolerated.

## 2022-05-22 NOTE — Progress Notes (Signed)
Established Patient Office Visit  Subjective:  Patient ID: Marcus Valencia, male    DOB: 27-Jan-1966  Age: 56 y.o. MRN: [id number]  CC:  Chief Complaint  Patient presents with   Follow-up    2 week follow up leg swelling left leg is worse and it has came down some but not a lot     HPI Marcus Valencia is a 56 y.o. male with past medical history of type 2 DM, HLD, chronic leg swelling and morbid obesity who presents for f/u of his chronic medical conditions.  He complains of chronic leg swelling, which is worse for the last 1 month.  He denies any dyspnea, orthopnea or PND.  He does report that he uses 3 flat pillows at nighttime.  Denies any chest pain or palpitations.  He went to urgent care and was later followed by NP, was given Lasix for leg swelling, which slightly improved his leg swelling.  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  He has tried using compression socks with some relief as well.  Type II DM: He had lost follow-up and had stopped taking metformin and glipizide.  Does not check blood glucose. His last HBA1C was 11.5 in 01/22. He denies any polyuria or polyphagia currently.  He also had drop taking Crestor for HLD.     Past Medical History:  Diagnosis Date   Diabetes ([place])    Hypertension     History reviewed. No pertinent surgical history.  History reviewed. No pertinent family history.  Social History   Socioeconomic History   Marital status: Legally Separated    Spouse name: Not on file   Number of children: Not on file   Years of education: Not on file   Highest education level: Not on file  Occupational History   Not on file  Tobacco Use   Smoking status: Former    Types: Cigarettes    Quit date: 12/06/2006    Years since quitting: 15.4   Smokeless tobacco: Never  Substance and Sexual Activity   Alcohol use: Yes    Alcohol/week: 3.0 standard drinks    Types: 1 Cans of beer, 2 Shots of liquor  per week    Comment: occ   Drug use: No   Sexual activity: Not on file  Other Topics Concern   Not on file  Social History Narrative   Not on file   Social Determinants of Health   Financial Resource Strain: Not on file  Food Insecurity: Not on file  Transportation Needs: Not on file  Physical Activity: Not on file  Stress: Not on file  Social Connections: Not on file  Intimate Partner Violence: Not on file    Outpatient Medications Prior to Visit  Medication Sig Dispense Refill   clotrimazole (LOTRIMIN) 1 % cream Apply to affected area 2 times daily 15 g 0   Vitamin D, Ergocalciferol, (DRISDOL) 1.25 MG (50000 UNIT) CAPS capsule Take 1 capsule (50,000 Units total) by mouth every 7 (seven) days. 5 capsule 5   glipiZIDE (GLUCOTROL) 10 MG tablet Take 1 tablet (10 mg total) by mouth 2 (two) times daily before a meal. 60 tablet 0   metFORMIN (GLUCOPHAGE) 500 MG tablet Take 1 tablet (500 mg total) by mouth 2 (two) times daily with a meal. 60 tablet 0   rosuvastatin (CRESTOR) 10 MG tablet Take 1 tablet (10 mg total) by mouth daily. 30 tablet 0  fluconazole (DIFLUCAN) 200 MG tablet Take one dose by mouth, wait 72 hours, and then take second dose by mouth (Patient not taking: Reported on 05/22/2022) 2 tablet 0   furosemide (LASIX) 20 MG tablet Take 2 tablets (40 mg total) by mouth daily for 14 days. 28 tablet 0   No facility-administered medications prior to visit.    No Known Allergies  ROS Review of Systems  Constitutional:  Negative for chills and fever.  HENT:  Negative for congestion and sore throat.   Eyes:  Negative for pain and discharge.  Respiratory:  Negative for cough and shortness of breath.   Cardiovascular:  Positive for leg swelling. Negative for chest pain and palpitations.  Gastrointestinal:  Negative for diarrhea, nausea and vomiting.  Endocrine: Negative for polydipsia and polyuria.  Genitourinary:  Negative for dysuria and hematuria.  Musculoskeletal:  Negative  for neck pain and neck stiffness.  Skin:  Negative for rash.  Neurological:  Negative for dizziness, weakness, numbness and headaches.  Psychiatric/Behavioral:  Negative for agitation and behavioral problems.      Objective:    Physical Exam Vitals reviewed.  Constitutional:      General: He is not in acute distress.    Appearance: He is obese. He is not diaphoretic.  HENT:     Head: Normocephalic and atraumatic.     Nose: Nose normal.     Mouth/Throat:     Mouth: Mucous membranes are moist.  Eyes:     General: No scleral icterus.    Extraocular Movements: Extraocular movements intact.  Cardiovascular:     Rate and Rhythm: Normal rate and regular rhythm.     Pulses: Normal pulses.     Heart sounds: Normal heart sounds. No murmur heard. Pulmonary:     Breath sounds: Normal breath sounds. No wheezing or rales.  Musculoskeletal:     Cervical back: Neck supple. No tenderness.     Right lower leg: Edema (2+) present.     Left lower leg: Edema (2+) present.  Skin:    General: Skin is warm.     Findings: No rash.  Neurological:     General: No focal deficit present.     Mental Status: He is alert and oriented to person, place, and time.     Sensory: No sensory deficit.     Motor: No weakness.  Psychiatric:        Mood and Affect: Mood normal.        Behavior: Behavior normal.    BP 128/88 (BP Location: Right Arm, Patient Position: Sitting, Cuff Size: Normal)   Pulse 86   Resp 18   Ht '5\' 10"'  (1.778 m)   Wt (!) 363 lb 6.4 oz (164.8 kg)   SpO2 98%   BMI 52.14 kg/m  Wt Readings from Last 3 Encounters:  05/22/22 (!) 363 lb 6.4 oz (164.8 kg)  05/03/22 (!) 360 lb 3.2 oz (163.4 kg)  01/14/21 (!) 358 lb (162.4 kg)    Lab Results  Component Value Date   TSH 1.080 01/14/2021   Lab Results  Component Value Date   WBC 7.0 01/14/2021   HGB 14.9 01/14/2021   HCT 46.3 01/14/2021   MCV 83 01/14/2021   PLT 281 01/14/2021   Lab Results  Component Value Date   NA 139  01/14/2021   K 4.2 01/14/2021   CO2 24 01/14/2021   GLUCOSE 270 (H) 01/14/2021   BUN 11 01/14/2021   CREATININE 1.00 01/14/2021   BILITOT 0.2  01/14/2021   ALKPHOS 147 (H) 01/14/2021   AST 12 01/14/2021   ALT 20 01/14/2021   PROT 6.8 01/14/2021   ALBUMIN 4.1 01/14/2021   CALCIUM 9.5 01/14/2021   Lab Results  Component Value Date   CHOL 171 01/14/2021   Lab Results  Component Value Date   HDL 37 (L) 01/14/2021   Lab Results  Component Value Date   LDLCALC 121 (H) 01/14/2021   Lab Results  Component Value Date   TRIG 65 01/14/2021   Lab Results  Component Value Date   CHOLHDL 4.6 01/14/2021   Lab Results  Component Value Date   HGBA1C 11.3 (H) 01/14/2021      Assessment & Plan:   Problem List Items Addressed This Visit       Endocrine   Uncontrolled type 2 diabetes mellitus with hyperglycemia ([place])    Lab Results  Component Value Date   HGBA1C 11.3 (H) 01/14/2021   On metformin 500 mg twice daily and glipizide 10 mg twice daily, noncompliant Advised to follow diabetic diet On statin F/u CMP and lipid panel Diabetic eye exam: Advised to follow up with Ophthalmology for diabetic eye exam       Relevant Medications   glipiZIDE (GLUCOTROL) 10 MG tablet   metFORMIN (GLUCOPHAGE) 500 MG tablet   rosuvastatin (CRESTOR) 10 MG tablet   Other Relevant Orders   Hemoglobin A1c   CMP14+EGFR   CBC with Differential/Platelet   Urine Microalbumin w/creat. ratio     Other   Morbid obesity ([facility])    Diet modification and moderate exercise advised Good candidate for GLP-1 agonist, but compliance is a big concern       Relevant Medications   glipiZIDE (GLUCOTROL) 10 MG tablet   metFORMIN (GLUCOPHAGE) 500 MG tablet   Other Relevant Orders   TSH   CBC with Differential/Platelet   Leg swelling - Primary    Chronic, likely due to high sodium intake, prolonged sitting posture and morbid obesity Lasix as needed Leg elevation Compression socks advised If  persistent, will check to Echo       Relevant Medications   furosemide (LASIX) 20 MG tablet   Other Relevant Orders   TSH   CBC with Differential/Platelet   Hyperlipidemia    On Crestor, advised to stay compliant       Relevant Medications   furosemide (LASIX) 20 MG tablet   rosuvastatin (CRESTOR) 10 MG tablet   Other Relevant Orders   Lipid panel   CBC with Differential/Platelet   Other Visit Diagnoses     Vitamin D deficiency       Relevant Orders   VITAMIN D 25 Hydroxy (Vit-D Deficiency, Fractures)       Meds ordered this encounter  Medications   glipiZIDE (GLUCOTROL) 10 MG tablet    Sig: Take 1 tablet (10 mg total) by mouth 2 (two) times daily before a meal.    Dispense:  60 tablet    Refill:  2   metFORMIN (GLUCOPHAGE) 500 MG tablet    Sig: Take 1 tablet (500 mg total) by mouth 2 (two) times daily with a meal.    Dispense:  60 tablet    Refill:  2   furosemide (LASIX) 20 MG tablet    Sig: Take 1 tablet (20 mg total) by mouth daily.    Dispense:  30 tablet    Refill:  2   rosuvastatin (CRESTOR) 10 MG tablet    Sig: Take 1 tablet (10 mg total)  by mouth daily.    Dispense:  30 tablet    Refill:  2    Follow-up: Return in about 6 weeks (around 07/03/2022) for Annual physical.    Lindell Spar, MD

## 2022-05-22 NOTE — Assessment & Plan Note (Signed)
Chronic, likely due to high sodium intake, prolonged sitting posture and morbid obesity Lasix as needed Leg elevation Compression socks advised If persistent, will check to Echo

## 2022-05-22 NOTE — Assessment & Plan Note (Signed)
Diet modification and moderate exercise advised Good candidate for GLP-1 agonist, but compliance is a big concern

## 2022-07-03 ENCOUNTER — Encounter: Payer: 59 | Admitting: Internal Medicine

## 2022-07-03 ENCOUNTER — Encounter: Payer: Self-pay | Admitting: Internal Medicine

## 2024-10-12 ENCOUNTER — Emergency Department (HOSPITAL_COMMUNITY)
Admission: EM | Admit: 2024-10-12 | Discharge: 2024-10-12 | Disposition: A | Discharge status: Home / Self Care | Payer: Self-pay | Attending: Emergency Medicine | Admitting: Emergency Medicine

## 2024-10-12 ENCOUNTER — Other Ambulatory Visit: Payer: Self-pay

## 2024-10-12 ENCOUNTER — Emergency Department (HOSPITAL_COMMUNITY): Payer: Self-pay

## 2024-10-12 ENCOUNTER — Encounter (HOSPITAL_COMMUNITY): Payer: Self-pay

## 2024-10-12 DIAGNOSIS — E119 Type 2 diabetes mellitus without complications: Secondary | ICD-10-CM | POA: Insufficient documentation

## 2024-10-12 DIAGNOSIS — S39012A Strain of muscle, fascia and tendon of lower back, initial encounter: Secondary | ICD-10-CM | POA: Diagnosis not present

## 2024-10-12 DIAGNOSIS — Z79899 Other long term (current) drug therapy: Secondary | ICD-10-CM | POA: Diagnosis not present

## 2024-10-12 DIAGNOSIS — Z7984 Long term (current) use of oral hypoglycemic drugs: Secondary | ICD-10-CM | POA: Insufficient documentation

## 2024-10-12 DIAGNOSIS — W108XXA Fall (on) (from) other stairs and steps, initial encounter: Secondary | ICD-10-CM | POA: Insufficient documentation

## 2024-10-12 DIAGNOSIS — I10 Essential (primary) hypertension: Secondary | ICD-10-CM | POA: Insufficient documentation

## 2024-10-12 DIAGNOSIS — T148XXA Other injury of unspecified body region, initial encounter: Secondary | ICD-10-CM

## 2024-10-12 DIAGNOSIS — M545 Low back pain, unspecified: Secondary | ICD-10-CM | POA: Diagnosis present

## 2024-10-12 DIAGNOSIS — S300XXA Contusion of lower back and pelvis, initial encounter: Secondary | ICD-10-CM | POA: Insufficient documentation

## 2024-10-12 MED ORDER — IBUPROFEN 400 MG PO TABS
600.0000 mg | ORAL_TABLET | Freq: Once | ORAL | Status: AC
  Administered 2024-10-12: 600 mg via ORAL
  Filled 2024-10-12: qty 2

## 2024-10-12 MED ORDER — LIDOCAINE 5 % EX PTCH
1.0000 | MEDICATED_PATCH | Freq: Once | CUTANEOUS | Status: DC
  Administered 2024-10-12: 1 via TRANSDERMAL
  Filled 2024-10-12: qty 1

## 2024-10-12 MED ORDER — ACETAMINOPHEN 500 MG PO TABS
1000.0000 mg | ORAL_TABLET | Freq: Once | ORAL | Status: AC
  Administered 2024-10-12: 1000 mg via ORAL
  Filled 2024-10-12: qty 2

## 2024-10-12 MED ORDER — CYCLOBENZAPRINE HCL 10 MG PO TABS
10.0000 mg | ORAL_TABLET | Freq: Three times a day (TID) | ORAL | 0 refills | Status: AC | PRN
Start: 2024-10-12 — End: ?

## 2024-10-12 MED ORDER — LIDOCAINE 5 % EX PTCH: 1.0000 | MEDICATED_PATCH | CUTANEOUS | 0 refills | Status: AC

## 2024-10-12 NOTE — ED Notes (Addendum)
 See triage notes. Pt in nad. A/o

## 2024-10-12 NOTE — ED Notes (Signed)
 Pt/family received d/c paperwork at this time. After going over the paperwork any questions, comments, or concerns were answered to the best of this nurse's knowledge. The pt/family verbally acknowledged the teachings/instructions.

## 2024-10-12 NOTE — ED Provider Notes (Signed)
 [place] EMERGENCY DEPARTMENT AT [facility] Provider Note   CSN: [id number] Arrival date & time: 10/12/24  1007     Patient presents with: Marcus Valencia is a 58 y.o. male with history of diabetes, hypertension, presents with concern for lower back pain after mechanical fall.  He reports he missed a step going down the stairs 3 days ago, and this caused him to fall onto his buttocks and lower back.  He denies hitting his head or any loss of consciousness.  He is not on any anticoagulation.  He denies feeling dizzy, or having any chest pain or shortness of breath before this fall.  He has been able to ambulate, but with some pain in the lower back.  Denies any numbness or tingling in his legs.  Denies any weakness in his legs.    Fall       Prior to Admission medications   Medication Sig Start Date End Date Taking? Authorizing Provider  cyclobenzaprine  (FLEXERIL ) 10 MG tablet Take 1 tablet (10 mg total) by mouth every 8 (eight) hours as needed for muscle spasms. 10/12/24  Yes Veta Palma, PA-C  lidocaine (LIDODERM) 5 % Place 1 patch onto the skin daily. Remove & Discard patch within 12 hours or as directed by MD 10/12/24  Yes Veta Palma, PA-C  clotrimazole  (LOTRIMIN ) 1 % cream Apply to affected area 2 times daily 09/02/21   Wurst, Brittany, PA-C  furosemide  (LASIX ) 20 MG tablet Take 1 tablet (20 mg total) by mouth daily. 05/22/22   Tobie Suzzane POUR, MD  glipiZIDE  (GLUCOTROL ) 10 MG tablet Take 1 tablet (10 mg total) by mouth 2 (two) times daily before a meal. 05/22/22   Tobie Suzzane POUR, MD  metFORMIN  (GLUCOPHAGE ) 500 MG tablet Take 1 tablet (500 mg total) by mouth 2 (two) times daily with a meal. 05/22/22   Tobie Suzzane POUR, MD  rosuvastatin  (CRESTOR ) 10 MG tablet Take 1 tablet (10 mg total) by mouth daily. 05/22/22   Tobie Suzzane POUR, MD  Vitamin D , Ergocalciferol , (DRISDOL ) 1.25 MG (50000 UNIT) CAPS capsule Take 1 capsule (50,000 Units total) by mouth every 7  (seven) days. 01/17/21   Tobie Suzzane POUR, MD    Allergies: Patient has no known allergies.    Review of Systems  Musculoskeletal:  Positive for back pain.    Updated Vital Signs BP (!) 138/102 (BP Location: Right Arm)   Pulse 78   Temp 98.8 F (37.1 C) (Oral)   Resp 18   Ht 5' 10 (1.778 m)   Wt (!) 158.8 kg   SpO2 98%   BMI 50.22 kg/m   Physical Exam Vitals and nursing note reviewed.  Constitutional:      General: He is not in acute distress.    Appearance: He is well-developed.  HENT:     Head: Normocephalic and atraumatic.  Eyes:     Conjunctiva/sclera: Conjunctivae normal.  Cardiovascular:     Rate and Rhythm: Normal rate and regular rhythm.     Heart sounds: No murmur heard.    Comments: 1+ pedal pulse bilaterally Pulmonary:     Effort: Pulmonary effort is normal. No respiratory distress.     Breath sounds: Normal breath sounds.  Abdominal:     Palpations: Abdomen is soft.     Tenderness: There is no abdominal tenderness.  Musculoskeletal:        General: No swelling.     Cervical back: Neck supple.     Comments:  No cervical tenderness to palpation.  Mild lower thoracic tenderness to palpation and lumbar tenderness to palpation.  Tender to palpation of the right thoracic and lumbar paraspinal musculature.  Nontender of the left sided paraspinal musculature.  Nontender diffusely of the upper or lower extremities bilaterally  Moves all extremities without difficulty.  Ambulates with mild antalgic gait.   Mild edema in the feet bilaterally  Skin:    General: Skin is warm and dry.     Capillary Refill: Capillary refill takes less than 2 seconds.  Neurological:     Mental Status: He is alert.     Comments: 5/5 strength with resisted hip flexion and extension, knee flexion and extension, ankle plantarflexion and dorsiflexion bilaterally  Intact and equal sensation in the bilateral lower extremities  Psychiatric:        Mood and Affect: Mood normal.      (all labs ordered are listed, but only abnormal results are displayed) Labs Reviewed - No data to display  EKG: None  Radiology: DG Thoracic Spine 2 View Result Date: 10/12/2024 CLINICAL DATA:  fall, pain Patient reports falling 2 days ago and now complains of back pain. EXAM: THORACIC SPINE 2 VIEWS; CHEST - 2 VIEW; LUMBAR SPINE - COMPLETE 4+ VIEW COMPARISON:  Limited correlation made with lumbar spine radiographs 08/11/2018. Chest radiographs 10/29/2001 unavailable. FINDINGS: Chest-two view: The heart is enlarged. There is vascular congestion without overt pulmonary edema. No confluent airspace disease, pneumothorax or significant pleural effusion. No acute osseous findings are evident. Thoracic spine: There are 12 rib-bearing thoracic type vertebral bodies. The alignment is normal. There is no evidence of acute fracture or traumatic subluxation. Multilevel spondylosis with endplate osteophytes. Lumbar spine: There are 5 lumbar type vertebral bodies. The alignment is normal. There is mild disc space narrowing, intervertebral spurring and facet hypertrophy throughout the lumbar spine. No evidence of acute fracture or pars defect. The sacroiliac joints appear intact. IMPRESSION: 1. No evidence of acute thoracic or lumbar spine fracture or traumatic subluxation. 2. Cardiomegaly with vascular congestion. No overt pulmonary edema or pleural effusion. 3. Multilevel thoracolumbar spondylosis. Electronically Signed   By: Marcus Valencia M.D.   On: 10/12/2024 11:36   DG Lumbar Spine Complete Result Date: 10/12/2024 CLINICAL DATA:  fall, pain Patient reports falling 2 days ago and now complains of back pain. EXAM: THORACIC SPINE 2 VIEWS; CHEST - 2 VIEW; LUMBAR SPINE - COMPLETE 4+ VIEW COMPARISON:  Limited correlation made with lumbar spine radiographs 08/11/2018. Chest radiographs 10/29/2001 unavailable. FINDINGS: Chest-two view: The heart is enlarged. There is vascular congestion without overt pulmonary  edema. No confluent airspace disease, pneumothorax or significant pleural effusion. No acute osseous findings are evident. Thoracic spine: There are 12 rib-bearing thoracic type vertebral bodies. The alignment is normal. There is no evidence of acute fracture or traumatic subluxation. Multilevel spondylosis with endplate osteophytes. Lumbar spine: There are 5 lumbar type vertebral bodies. The alignment is normal. There is mild disc space narrowing, intervertebral spurring and facet hypertrophy throughout the lumbar spine. No evidence of acute fracture or pars defect. The sacroiliac joints appear intact. IMPRESSION: 1. No evidence of acute thoracic or lumbar spine fracture or traumatic subluxation. 2. Cardiomegaly with vascular congestion. No overt pulmonary edema or pleural effusion. 3. Multilevel thoracolumbar spondylosis. Electronically Signed   By: Marcus Valencia M.D.   On: 10/12/2024 11:36   DG Chest 2 View Result Date: 10/12/2024 CLINICAL DATA:  fall, pain Patient reports falling 2 days ago and now complains of back pain.  EXAM: THORACIC SPINE 2 VIEWS; CHEST - 2 VIEW; LUMBAR SPINE - COMPLETE 4+ VIEW COMPARISON:  Limited correlation made with lumbar spine radiographs 08/11/2018. Chest radiographs 10/29/2001 unavailable. FINDINGS: Chest-two view: The heart is enlarged. There is vascular congestion without overt pulmonary edema. No confluent airspace disease, pneumothorax or significant pleural effusion. No acute osseous findings are evident. Thoracic spine: There are 12 rib-bearing thoracic type vertebral bodies. The alignment is normal. There is no evidence of acute fracture or traumatic subluxation. Multilevel spondylosis with endplate osteophytes. Lumbar spine: There are 5 lumbar type vertebral bodies. The alignment is normal. There is mild disc space narrowing, intervertebral spurring and facet hypertrophy throughout the lumbar spine. No evidence of acute fracture or pars defect. The sacroiliac joints  appear intact. IMPRESSION: 1. No evidence of acute thoracic or lumbar spine fracture or traumatic subluxation. 2. Cardiomegaly with vascular congestion. No overt pulmonary edema or pleural effusion. 3. Multilevel thoracolumbar spondylosis. Electronically Signed   By: Marcus Valencia M.D.   On: 10/12/2024 11:36     Procedures   Medications Ordered in the ED  ibuprofen  (ADVIL ) tablet 600 mg (has no administration in time range)  lidocaine (LIDODERM) 5 % 1 patch (has no administration in time range)  acetaminophen (TYLENOL) tablet 1,000 mg (1,000 mg Oral Given 10/12/24 1038)                                    Medical Decision Making Amount and/or Complexity of Data Reviewed Radiology: ordered.  Risk OTC drugs. Prescription drug management.     Differential diagnosis includes but is not limited to Musculoskeletal pain, radiculopathy, spinal stenosis, herniated nucleus pulposis, fracture, cauda equina, epidural abscess, shingles, nephrolithiasis    ED Course:  Upon initial evaluation, patient is very well-appearing, no acute distress.  Stable vitals aside from slightly elevated blood pressure 130/102 upon arrival.  He has some mild lower thoracic and lumbar tenderness to palpation on exam.  Also tender to the musculature on the right side of the thoracic and lumbar spine.  He is neurovascularly intact in the bilateral lower extremities.  He does not have any pain in the arms or legs.  Will obtain x-ray imaging of the thoracic and lumbar spine as well as chest x-ray to rule out rib fracture.   Imaging Studies ordered: I ordered imaging studies including chest x-ray, x-ray lumbar spine, x-ray thoracic spine I independently visualized the imaging with scope of interpretation limited to determining acute life threatening conditions related to emergency care. Imaging showed  IMPRESSION:  1. No evidence of acute thoracic or lumbar spine fracture or  traumatic subluxation.  2.  Cardiomegaly with vascular congestion. No overt pulmonary edema  or pleural effusion.  3. Multilevel thoracolumbar spondylosis.   I agree with the radiologist interpretation  Medications Given: Tylenol Ibuprofen  Lidocaine patch  Upon re-evaluation, patient remains well-appearing.  Discussed that his x-rays of the lumbar spine, thoracic spine, chest x-ray did not show any evidence of a acute fracture or dislocation.  I have low concern for occult fracture given no significant spinal tenderness to palpation, ambulates without difficulty, and he is neurovascularly intact in the bilateral lower extremities.  Suspect he may have a bone contusion to the spine where he hit his back, as well as a lumbar strain given the tenderness to the lumbar musculature on the right side.  His pain was managed here with Tylenol, ibuprofen , and lidocaine patches.  Feel he is stable and appropriate for discharge home at this time    Impression: Lumbar strain Bone contusion  Disposition:  The patient was discharged home with instructions to use Tylenol and ibuprofen  as needed for pain at home.  Lidocaine patches on the back as needed for pain.  Flexeril  as needed for pain.  He understands that Flexeril  may make him drowsy and do not drink alcohol, drive, or operate any other heavy machinery after taking this medication.  Follow-up with PCP if symptoms not improving within the next 2 to 3 weeks for recheck. Return precautions given and patient verbalized understanding.  This chart was dictated using voice recognition software, Dragon. Despite the best efforts of this provider to proofread and correct errors, errors may still occur which can change documentation meaning.       Final diagnoses:  Contusion of bone  Lumbar strain, initial encounter    ED Discharge Orders          Ordered    cyclobenzaprine  (FLEXERIL ) 10 MG tablet  Every 8 hours PRN        10/12/24 1252    lidocaine (LIDODERM) 5 %  Every 24  hours        10/12/24 1252               Veta Palma, PA-C 10/12/24 1258    Melvenia Motto, MD 10/12/24 [phone]

## 2024-10-12 NOTE — Discharge Instructions (Signed)
 Your x-rays today do not show any sign of a fracture or dislocation.  I suspect your back pain is due to a bruise on the bone where you hit your back.  This will improve with time.  It seems you also have a strain of the muscle in the right side of your back.  This will also improve over the next 4 weeks.  Please engage in light physical activity (like walking) to prevent your back pain from worsening and to prevent stiffness. Refrain from bedrest which can make your pain worse.   You may use up to 600mg  ibuprofen  every 6 hours as needed for pain.  Do not exceed 2.4g of ibuprofen  per day.  You were given your first dose here today.   You may take up to 1000mg  of tylenol every 6 hours as needed for pain.  Do not take more then 4g per day.    You may use a heating pack on your back to help with the pain.  You have been prescribed a muscle relaxer called Flexeril  (cyclobenzaprine ). You may take 0.5 - 1 tablet (5-10mg ) before bed as needed for muscle pain. This medication can be sedating. Do not drive or operate heavy machinery after taking this medicine. Do not drink alcohol or take other sedating medications when taking this medicine for safety reasons.  Keep this out of reach of small children.    You have been prescribed lidocaine patched to help with pain. You may apply one patch to your back for up to 12 hours at a time. Then, you must remove the patch for a full 12 hours before re-applying a new patch.    Please contact your PCP for a follow up if your back pain does not start to improve over the next 2-3 weeks.  Return to the ER if you have loss of bowel or bladder control, you develop fever, you have numbness in your groin, or if you have any other new or concerning symptoms.

## 2024-10-12 NOTE — ED Triage Notes (Signed)
 Pt to er, pt states that Friday he fell on the steps and hit his back, states that he is here for back pain.  Pt states that he feel down three steps, states that he slipped on the steps.  Pt denies loss of bowel or bladder function, denies numbness.
